# Patient Record
Sex: Male | Born: 1990 | Race: White | Hispanic: No | Marital: Single | Smoking: Light tobacco smoker
Health system: Southern US, Community
[De-identification: ages and names within clinical notes are randomized; demographics above are authoritative.]

## PROBLEM LIST (undated history)

## (undated) DIAGNOSIS — Z8349 Family history of other endocrine, nutritional and metabolic diseases: Secondary | ICD-10-CM

## (undated) DIAGNOSIS — M47812 Spondylosis without myelopathy or radiculopathy, cervical region: Secondary | ICD-10-CM

## (undated) DIAGNOSIS — G4733 Obstructive sleep apnea (adult) (pediatric): Secondary | ICD-10-CM

## (undated) DIAGNOSIS — F411 Generalized anxiety disorder: Secondary | ICD-10-CM

## (undated) DIAGNOSIS — E782 Mixed hyperlipidemia: Secondary | ICD-10-CM

## (undated) DIAGNOSIS — F419 Anxiety disorder, unspecified: Secondary | ICD-10-CM

## (undated) DIAGNOSIS — K589 Irritable bowel syndrome without diarrhea: Secondary | ICD-10-CM

## (undated) DIAGNOSIS — R350 Frequency of micturition: Secondary | ICD-10-CM

## (undated) DIAGNOSIS — F1010 Alcohol abuse, uncomplicated: Secondary | ICD-10-CM

## (undated) DIAGNOSIS — N529 Male erectile dysfunction, unspecified: Secondary | ICD-10-CM

## (undated) DIAGNOSIS — K2920 Alcoholic gastritis without bleeding: Secondary | ICD-10-CM

## (undated) DIAGNOSIS — E291 Testicular hypofunction: Secondary | ICD-10-CM

## (undated) DIAGNOSIS — K219 Gastro-esophageal reflux disease without esophagitis: Secondary | ICD-10-CM

## (undated) HISTORY — DX: Alcohol abuse, uncomplicated: F10.10

## (undated) HISTORY — DX: Male erectile dysfunction, unspecified: N52.9

## (undated) HISTORY — DX: Irritable bowel syndrome without diarrhea: K58.9

## (undated) HISTORY — DX: Generalized anxiety disorder: F41.1

## (undated) HISTORY — DX: Testicular hypofunction: E29.1

## (undated) HISTORY — DX: Obstructive sleep apnea (adult) (pediatric): G47.33

## (undated) HISTORY — DX: Mixed hyperlipidemia: E78.2

## (undated) HISTORY — DX: Spondylosis without myelopathy or radiculopathy, cervical region: M47.812

## (undated) HISTORY — DX: Alcoholic gastritis without bleeding: K29.20

## (undated) HISTORY — DX: Gastro-esophageal reflux disease without esophagitis: K21.9

## (undated) HISTORY — DX: Family history of other endocrine, nutritional and metabolic diseases: Z83.49

## (undated) HISTORY — DX: Anxiety disorder, unspecified: F41.9

## (undated) HISTORY — DX: Frequency of micturition: R35.0

## (undated) HISTORY — PX: ESOPHAGOGASTRODUODENOSCOPY: SHX1529

## (undated) HISTORY — PX: CYSTOSCOPY: SUR368

---
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
## 2011-06-15 ENCOUNTER — Ambulatory Visit: Payer: Self-pay | Admitting: Family Medicine

## 2011-06-16 ENCOUNTER — Ambulatory Visit (INDEPENDENT_AMBULATORY_CARE_PROVIDER_SITE_OTHER): Payer: BC Managed Care – PPO | Admitting: Family Medicine

## 2011-06-16 ENCOUNTER — Encounter: Payer: Self-pay | Admitting: Family Medicine

## 2011-06-16 VITALS — BP 117/68 | HR 48 | Temp 98.2°F | Wt 168.0 lb

## 2011-06-16 DIAGNOSIS — B9789 Other viral agents as the cause of diseases classified elsewhere: Secondary | ICD-10-CM

## 2011-06-16 DIAGNOSIS — B349 Viral infection, unspecified: Secondary | ICD-10-CM

## 2011-06-16 DIAGNOSIS — Z23 Encounter for immunization: Secondary | ICD-10-CM

## 2011-06-16 NOTE — Progress Notes (Signed)
Office Note 06/16/2011  CC:  Chief Complaint  Patient presents with  . Establish Care    sinus infection    HPI:  Andre Raymond is a 21 y.o. White male who is here to establish care and discuss recent resp illness. Patient's most recent primary MD: Dr. Vernon Prey at [facility] in [place], [place]. Old records were not reviewed prior to or during today's visit.  Pt presents complaining of respiratory symptoms for about 2 days.  Mostly nasal congestion/runny nose, and PND.  No significant cough except some in the morning.  Had n/v/d initially for 2-3 d but this has resolved.  Appetite still down and gets brief twinges of nausea (primarily in mornings) that are affecting his PO intake--has lost about 10 lb in 2 wks.  Worst symptoms seems to be the intermittent nausea.  Lately the symptoms seem to be improving.  No fevers, no HA, no rash, no wheezing or SOB.  No ST.  Symptoms made worse by morning.  Symptoms improved by mucinex for brief periods (mucous gets clearer instead of green). Smoker? no Recent sick contact? Yes (sib) Muscle or joint aches? no Flu shot this season at least 2 wks ago? no  ROS: no n/v/d or abdominal pain.  No rash.  No neck stiffness.   +Mild fatigue.  +Mild appetite loss.   History reviewed. No pertinent past medical history.  Past Surgical History  Procedure Date  . Foot surgery 2004-05    Right foot--Bone spurs [facility])    Family History  Problem Relation Age of Onset  . Hypertension Father   . Hyperlipidemia Father   . Heart disease Maternal Grandfather     History   Social History  . Marital Status: Single    Spouse Name: N/A    Number of Children: N/A  . Years of Education: N/A   Occupational History  . Not on file.   Social History Main Topics  . Smoking status: Never Smoker   . Smokeless tobacco: Never Used  . Alcohol Use: Yes  . Drug Use: No  . Sexually Active: Not on file   Other Topics Concern  . 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    Outpatient Encounter Prescriptions as of 06/16/2011  Medication Sig Dispense Refill  . pseudoephedrine-guaifenesin (MUCINEX D) 60-600 MG per tablet Take 1 tablet by mouth every 12 (twelve) hours.        No Known Allergies  ROS Review of Systems  Constitutional: Positive for appetite change. Negative for fever, chills and fatigue.  HENT: Positive for congestion and postnasal drip. Negative for ear pain, sore throat, neck stiffness, dental problem and sinus pressure.   Eyes: Negative for discharge, redness and visual disturbance.  Respiratory: Negative for cough, chest tightness, shortness of breath and wheezing.   Cardiovascular: Negative for chest pain, palpitations and leg swelling.  Gastrointestinal: Negative for nausea, vomiting, abdominal pain, diarrhea and blood in stool.       See HPI   Genitourinary: Negative for dysuria, urgency, frequency, hematuria, flank pain and difficulty urinating.  Musculoskeletal: Negative for myalgias, back pain, joint swelling and arthralgias.  Skin: Negative for pallor and rash.  Neurological: Negative for dizziness, speech difficulty, weakness and headaches.  Hematological: Negative for adenopathy. Does not bruise/bleed easily.  Psychiatric/Behavioral: Negative for confusion and sleep disturbance. The patient is not  nervous/anxious.     PE; Blood pressure 117/68, pulse 48, temperature 98.2 F (36.8 C), temperature source Temporal, weight 168 lb (76.204 kg), SpO2 99.00%. VS: noted--normal. Gen: alert, NAD, well APPEARING. HEENT: eyes without injection, drainage, or swelling.  Ears: EACs clear, TMs with normal light reflex and landmarks.  Nose: Clear rhinorrhea, with some dried, crusty exudate adherent to mildly injected mucosa.  No purulent d/c.  No paranasal sinus TTP.  No facial  swelling.  Throat and mouth without focal lesion.  No pharyngial swelling, erythema, or exudate.   Neck: supple, no LAD.   LUNGS: CTA bilat, nonlabored resps.   CV: RRR, no m/r/g. ABD: soft, NT, ND, BS normal.  No hepatospenomegaly or mass.  No bruits. EXT: no c/c/e SKIN: no rash  Pertinent labs:  none  ASSESSMENT AND PLAN:   Viral syndrome Self-limited nature of this illness was discussed, questions answered.  Discussed symptomatic care, rest, fluids.   Warning signs/symptoms of worsening illness were discussed.  Patient instructed to call or return if any of these occur. D/C mucinex D. Start allegra D 24h OTC qd and nasal saline spray 2-3 times per day. If PND and intermittent nausea are persisting in another week then I asked him to call me back or return. Flu vaccine given IM today.   Flu vaccine given IM today.  Return if symptoms worsen or fail to improve.

## 2011-06-16 NOTE — Patient Instructions (Signed)
Allegra D 24 hour (generic), 1 tab qd. Saline nasal spray (OTC): 1-2 sprays each nostril 2-3 times per day. Stop mucinex-D.

## 2011-06-16 NOTE — Assessment & Plan Note (Signed)
Self-limited nature of this illness was discussed, questions answered.  Discussed symptomatic care, rest, fluids.   Warning signs/symptoms of worsening illness were discussed.  Patient instructed to call or return if any of these occur. D/C mucinex D. Start allegra D 24h OTC qd and nasal saline spray 2-3 times per day. If PND and intermittent nausea are persisting in another week then I asked him to call me back or return. Flu vaccine given IM today.

## 2011-06-24 ENCOUNTER — Encounter: Payer: Self-pay | Admitting: Family Medicine

## 2011-06-24 ENCOUNTER — Ambulatory Visit (INDEPENDENT_AMBULATORY_CARE_PROVIDER_SITE_OTHER): Payer: BC Managed Care – PPO | Admitting: Family Medicine

## 2011-06-24 VITALS — BP 125/72 | HR 61 | Ht 72.0 in | Wt 167.0 lb

## 2011-06-24 DIAGNOSIS — F411 Generalized anxiety disorder: Secondary | ICD-10-CM

## 2011-06-24 MED ORDER — PAROXETINE HCL 20 MG PO TABS: 20.0000 mg | ORAL_TABLET | ORAL | Status: DC

## 2011-06-24 NOTE — Assessment & Plan Note (Signed)
With some panic and mild agoraphobia. Start paxil 20mg  qd.  Therapeutic expectations and side effect profile of medication discussed today.  Patient's questions answered. Recheck in office in 3-4 wks.

## 2011-06-24 NOTE — Progress Notes (Signed)
OFFICE VISIT  06/24/2011   CC:  Chief Complaint  Patient presents with  . Depression    discuss depression and anger issues     HPI:    Patient is a 21 y.o. Caucasian male who presents for anxiety. Describes always being nervous/anxious, worries about everything, easily frustrated/irritated, lets things eat at him, feels like he has had it all his life but with transition into adulthood and its increased responsibilities he is not coping with it as well.  Parents are noting he's getting worse and encouraged him to come in and get help.  Describes some periods in which his anxiety builds to a crescendo---panic sx's, palpitations/heart racing, lightheaded, knot in stomach.  More periods of decreased energy lately as well as more appetite loss.  Concentration is often poor lately, always feels keyed up.  Some mild sleep dysfunction but nothing consistent. These episodes always occur in the context of existing anxiety, never unprovoked or "out of the blue".  Describes mild agoraphobia as well.  Has never been on meds for anxiety, never been to a counselor.  Denies depressed/sad mood currently but admits to going through periods of being "down in the dumps" for a couple of weeks at a time and then he pulls out of it.  Never has been suicidal.  Never has abused alcohol or drugs. Mom and dad both struggle with anxiety.  Of note, he says he is comletely well regarding his recent viral syndrome that I saw him for about a week ago.  History reviewed. No pertinent past medical history. No hospitalizations, no significant illnesses.  Past Surgical History  Procedure Date  . Foot surgery 2004-05    Right foot--Bone spurs [facility])    MEDS: none  No Known Allergies  ROS No hearing or vision changes, no headaches, no focal weakness, no rash, no arthralgias, no n/v/d, no chest pain or SOB, no urinary complaints.   PE: Blood pressure 125/72, pulse 61, height 6' (1.829 m), weight 167 lb (75.751  kg). Wt Readings from Last 2 Encounters:  06/24/11 167 lb (75.751 kg)  06/16/11 168 lb (76.204 kg)    Gen: alert, oriented x 4, affect pleasant.  Lucid thinking and conversation noted. HEENT: PERRLA, EOMI.   Neck: no LAD, mass, or thyromegaly. CV: RRR, no m/r/g LUNGS: CTA bilat, nonlabored. NEURO: no tremor or tics noted on observation.  Coordination intact. CN 2-12 grossly intact bilaterally, strength 5/5 in all extremeties.  No ataxia.   LABS:  none  IMPRESSION AND PLAN:  Generalized anxiety disorder With some panic and mild agoraphobia. Start paxil 20mg  qd.  Therapeutic expectations and side effect profile of medication discussed today.  Patient's questions answered. Recheck in office in 3-4 wks.     FOLLOW UP: No Follow-up on file.

## 2011-08-19 ENCOUNTER — Ambulatory Visit (INDEPENDENT_AMBULATORY_CARE_PROVIDER_SITE_OTHER): Payer: BC Managed Care – PPO | Admitting: Family Medicine

## 2011-08-19 ENCOUNTER — Telehealth: Payer: Self-pay | Admitting: Family Medicine

## 2011-08-19 ENCOUNTER — Encounter: Payer: Self-pay | Admitting: Family Medicine

## 2011-08-19 VITALS — BP 129/69 | HR 59 | Temp 99.0°F | Ht 72.0 in | Wt 158.8 lb

## 2011-08-19 DIAGNOSIS — F411 Generalized anxiety disorder: Secondary | ICD-10-CM

## 2011-08-19 DIAGNOSIS — F32A Depression, unspecified: Secondary | ICD-10-CM

## 2011-08-19 DIAGNOSIS — R634 Abnormal weight loss: Secondary | ICD-10-CM

## 2011-08-19 DIAGNOSIS — R110 Nausea: Secondary | ICD-10-CM

## 2011-08-19 DIAGNOSIS — K589 Irritable bowel syndrome without diarrhea: Secondary | ICD-10-CM

## 2011-08-19 DIAGNOSIS — F341 Dysthymic disorder: Secondary | ICD-10-CM

## 2011-08-19 DIAGNOSIS — F329 Major depressive disorder, single episode, unspecified: Secondary | ICD-10-CM

## 2011-08-19 LAB — RENAL FUNCTION PANEL
Albumin: 4.6 g/dL (ref 3.5–5.2)
BUN: 12 mg/dL (ref 6–23)
CO2: 29 mEq/L (ref 19–32)
Calcium: 9.9 mg/dL (ref 8.4–10.5)
Chloride: 100 mEq/L (ref 96–112)
Creatinine, Ser: 0.9 mg/dL (ref 0.4–1.5)
GFR: 108.21 mL/min (ref >60.00)
Glucose, Bld: 85 mg/dL (ref 70–99)
Phosphorus: 2.9 mg/dL (ref 2.3–4.6)
Potassium: 4.1 mEq/L (ref 3.5–5.1)
Sodium: 139 mEq/L (ref 135–145)

## 2011-08-19 LAB — CBC
HCT: 44.3 % (ref 39.0–52.0)
Hemoglobin: 14.7 g/dL (ref 13.0–17.0)
MCHC: 33.3 g/dL (ref 30.0–36.0)
Platelets: 178 10*3/uL (ref 150.0–400.0)
RBC: 4.69 Mil/uL (ref 4.22–5.81)

## 2011-08-19 LAB — HEPATIC FUNCTION PANEL
Bilirubin, Direct: 0.2 mg/dL (ref 0.0–0.3)
Total Protein: 8 g/dL (ref 6.0–8.3)

## 2011-08-19 MED ORDER — SERTRALINE HCL 50 MG PO TABS: 50.0000 mg | ORAL_TABLET | Freq: Every day | ORAL | Status: DC

## 2011-08-19 MED ORDER — ONDANSETRON HCL 8 MG PO TABS: 8.0000 mg | ORAL_TABLET | Freq: Three times a day (TID) | ORAL | Status: AC | PRN

## 2011-08-19 NOTE — Patient Instructions (Signed)
Depression, Adolescent and Adult Depression is a true and treatable medical condition. In general there are two kinds of depression:  Depression we all experience in some form. For example depression from the death of a loved one, financial distress or natural disasters will trigger or increase depression.   Clinical depression, on the other hand, appears without an apparent cause or reason. This depression is a disease. Depression may be caused by chemical imbalance in the body and brain or may come as a response to a physical illness. Alcohol and other drugs can cause depression.  DIAGNOSIS  The diagnosis of depression is usually based upon symptoms and medical history. TREATMENT  Treatments for depression fall into three categories. These are:  Drug therapy. There are many medicines that treat depression. Responses may vary and sometimes trial and error is necessary to determine the best medicines and dosage for a particular patient.   Psychotherapy, also called talking treatments, helps people resolve their problems by looking at them from a different point of view and by giving people insight into their own personal makeup. Traditional psychotherapy looks at a childhood source of a problem. Other psychotherapy will look at current conflicts and move toward solving those. If the cause of depression is drug use, counseling is available to help abstain. In time the depression will usually improve. If there were underlying causes for the chemical use, they can be addressed.   ECT (electroconvulsive therapy) or shock treatment is not as commonly used today. It is a very effective treatment for severe suicidal depression. During ECT electrical impulses are applied to the head. These impulses cause a generalized seizure. It can be effective but causes a loss of memory for recent events. Sometimes this loss of memory may include the last several months.  Treat all depression or suicide threats as  serious. Obtain professional help. Do not wait to see if serious depression will get better over time without help. Seek help for yourself or those around you. In the [place] the number to the National Suicide Help Lines With 24 Hour Help Are: 1-800-SUICIDE 318-343-4627 Document Released: 04/09/2000 Document Revised: 04/01/2011 Document Reviewed: 11/29/2007 [facility] Patient Information 2012 [place], [place].  GINGER

## 2011-08-20 MED ORDER — RANITIDINE HCL 300 MG PO TABS: 300.0000 mg | ORAL_TABLET | Freq: Every day | ORAL | Status: DC

## 2011-08-20 NOTE — Telephone Encounter (Signed)
I spoke with patient's mom and she is going to call and speak with Abhi to see if she could get him to come in.

## 2011-08-20 NOTE — Telephone Encounter (Signed)
Patient's mother is requesting a CB, she states she is "having major issues with [name]"

## 2011-08-26 ENCOUNTER — Encounter: Payer: Self-pay | Admitting: Family Medicine

## 2011-08-26 DIAGNOSIS — K589 Irritable bowel syndrome without diarrhea: Secondary | ICD-10-CM

## 2011-08-26 HISTORY — DX: Irritable bowel syndrome, unspecified: K58.9
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
## 2011-08-26 NOTE — Assessment & Plan Note (Signed)
Struggling with dyspepsia, abdominal pain, nausea and vomiting. Is given ondansetron for nausea and Zantac for the dyspepsia. Avoid spicy and fatty foods.
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
## 2011-09-17 ENCOUNTER — Ambulatory Visit: Payer: BC Managed Care – PPO | Admitting: Family Medicine

## 2011-09-17 DIAGNOSIS — Z0289 Encounter for other administrative examinations: Secondary | ICD-10-CM

## 2011-11-02 ENCOUNTER — Other Ambulatory Visit: Payer: Self-pay | Admitting: Family Medicine

## 2012-01-03 ENCOUNTER — Other Ambulatory Visit: Payer: Self-pay | Admitting: Family Medicine

## 2012-01-03 NOTE — Telephone Encounter (Signed)
PC to pt number [phone] and was told I had the wrong number.  PC to pt mother per DPR and was given correct number.  Mother states it may be hard to get in touch with him.  Mother wanted to know if [facility] due for appt.  Advised yes.  appt scheduled for 01/26/12 @ 330.  30 day supply sent to pharmacy.

## 2012-01-26 ENCOUNTER — Ambulatory Visit: Payer: BC Managed Care – PPO | Admitting: Family Medicine

## 2012-10-31 ENCOUNTER — Ambulatory Visit (INDEPENDENT_AMBULATORY_CARE_PROVIDER_SITE_OTHER): Payer: BC Managed Care – PPO | Admitting: Nurse Practitioner

## 2012-10-31 ENCOUNTER — Encounter: Payer: Self-pay | Admitting: Nurse Practitioner

## 2012-10-31 VITALS — BP 110/80 | HR 110 | Temp 97.9°F | Ht 72.0 in | Wt 181.2 lb

## 2012-10-31 DIAGNOSIS — R1031 Right lower quadrant pain: Secondary | ICD-10-CM

## 2012-10-31 DIAGNOSIS — R300 Dysuria: Secondary | ICD-10-CM

## 2012-10-31 LAB — POCT URINALYSIS DIPSTICK
Protein, UA: NEGATIVE
Spec Grav, UA: 1.01
Urobilinogen, UA: 0.2

## 2012-10-31 MED ORDER — CIPROFLOXACIN HCL 250 MG PO TABS: 250.0000 mg | ORAL_TABLET | Freq: Two times a day (BID) | ORAL | Status: DC

## 2012-10-31 NOTE — Patient Instructions (Signed)
Increase fluids. Call if symptoms not improving.  Urethritis, Adult Urethritis is an inflammation (soreness) of the urethra (the tube exiting from the bladder). It is often caused by germs that may be spread through sexual contact. TREATMENT  Urethritis will usually respond to antibiotics. These are medications that kill germs. Take all the medicine given to you. You may feel better in a couple days, but TAKE ALL MEDICINE or the infection may not be completely cured and may become more difficult to treat. Response can generally be expected in 7 to 10 days. You may require additional treatment after more testing. HOME CARE INSTRUCTIONS  Not have sex until the test results are known and treatment is completed.  Know that you may be asked to notify your sex partner when your final test results are back.  Finish all medications as prescribed.  Prevent sexually transmitted infections including AIDS. Practice safe sex. Use condoms. SEEK MEDICAL CARE IF:   Your symptoms are not improved in 2 to 3 days.  Your symptoms are getting worse.  Your develop abdominal pain.  You develop joint pain. SEEK IMMEDIATE MEDICAL CARE IF:   You have a fever.  You develop severe pain in the belly, back or side.  You develop repeated vomiting. TEST RESULTS Not all test results are available during your visit. If your test results are not back during the visit, make an appointment with your caregiver to find out the results. Do not assume everything is normal if you have not heard from your caregiver or the medical facility. It is important for you to follow-up on all of your test results. Document Released: 10/06/2000 Document Revised: 07/05/2011 Document Reviewed: 04/28/2009 [facility] Patient Information 2014 [place], [place].

## 2012-10-31 NOTE — Progress Notes (Signed)
  Subjective:    Patient ID: Andre Raymond, male    DOB: 02/19/1991, 22 y.o.   MRN: [id number]  Dysuria  This is a new problem. The current episode started in the past 7 days (3days ago after eating sushi that caused diarrhea & vomitimg). The problem occurs every urination. The problem has been gradually improving (still loose stools, but getting better, n&v resolved, continues to have dysuria & low pelvic pain). The quality of the pain is described as stabbing. The pain is moderate. Maximum temperature: reports chills & sweating 2 days ago. He is not sexually active. There is no history of pyelonephritis. Associated symptoms include chills, nausea, sweats and vomiting (resolved). Pertinent negatives include no discharge, flank pain, frequency, hematuria or urgency. There is no history of kidney stones or recurrent UTIs.      Review of Systems  Constitutional: Positive for chills.  Respiratory: Negative for cough and shortness of breath.   Cardiovascular: Negative for chest pain.  Gastrointestinal: Positive for nausea, vomiting (resolved), abdominal pain (RLQ, sharp, shooting, intermittent) and diarrhea (now loose, rather than diarrhea).  Genitourinary: Positive for dysuria and penile pain (urethral pain). Negative for urgency, frequency, hematuria, flank pain, discharge, penile swelling, genital sores and testicular pain.  Musculoskeletal: Negative for arthralgias.  Neurological: Negative for headaches.  Hematological: Negative for adenopathy.       Objective:   Physical Exam  Vitals reviewed. Constitutional: He is oriented to person, place, and time. He appears well-developed and well-nourished. No distress.  HENT:  Head: Normocephalic and atraumatic.  Eyes: Conjunctivae are normal. Right eye exhibits no discharge. Left eye exhibits no discharge.  Cardiovascular: Normal rate.   Pulmonary/Chest: Effort normal.  Abdominal: Soft. Bowel sounds are normal. He exhibits no distension and no  mass. There is no tenderness. There is no rebound and no guarding.  Musculoskeletal:  Normal gait, no CVA tenderness  Neurological: He is alert and oriented to person, place, and time.  Skin: Skin is warm and dry.  Psychiatric: He has a normal mood and affect. His behavior is normal. Thought content normal.          Assessment & Plan:  1. Dysuria WU:[id number], cystitis - Urine culture - POCT urinalysis dipstick - GC probe amplification, urine - ciprofloxacin (CIPRO) 250 MG tablet; Take 1 tablet (250 mg total) by mouth 2 (two) times daily.  Dispense: 6 tablet; Refill: 0  2. Abdominal pain, RLQ (right lower quadrant) Possibly r/t food poisoning, symptoms resolving, keeping food & fluids down yesterday & today

## 2012-11-01 LAB — NEISSERIA GONORRHOEAE, PROBE AMP: GC Probe RNA: NEGATIVE

## 2012-11-03 LAB — URINE CULTURE: Colony Count: >=100000

## 2013-01-24 ENCOUNTER — Ambulatory Visit (INDEPENDENT_AMBULATORY_CARE_PROVIDER_SITE_OTHER): Payer: BC Managed Care – PPO | Admitting: Family Medicine

## 2013-01-24 ENCOUNTER — Encounter: Payer: Self-pay | Admitting: Family Medicine

## 2013-01-24 VITALS — BP 121/72 | HR 56 | Temp 98.9°F | Resp 16 | Ht 72.0 in | Wt 175.0 lb

## 2013-01-24 DIAGNOSIS — M2420 Disorder of ligament, unspecified site: Secondary | ICD-10-CM

## 2013-01-24 NOTE — Progress Notes (Signed)
OFFICE NOTE  01/24/2013  CC:  Chief Complaint  Patient presents with  . poping sound    in neck      HPI: Patient is a 22 y.o. Caucasian male who is here for a popping sound coming from the back of his head/neck. No pain.  No hx of injury to neck or skull.  He hears the sound exclusively when he runs, [place] and heard it.  It goes away when he is still.  It does not occur every time he runs.  Denies ear pain, popping, fullness, or decreased hearing.   Pertinent PMH:  Past Medical History  Diagnosis Date  . IBS (irritable bowel syndrome) 08/26/2011    MEDS:  NONE  PE: Blood pressure 121/72, pulse 56, temperature 98.9 F (37.2 C), temperature source Temporal, resp. rate 16, height 6' (1.829 m), weight 175 lb (79.379 kg), SpO2 100.00%. Gen: Alert, well appearing.  Patient is oriented to person, place, time, and situation. ENT: Ears: EACs clear, normal epithelium.  TMs with good light reflex and landmarks bilaterally.  Eyes: no injection, icteris, swelling, or exudate.  EOMI, PERRLA. Nose: no drainage or turbinate edema/swelling.  No injection or focal lesion.  Mouth: lips without lesion/swelling.  Oral mucosa pink and moist.  Dentition intact and without obvious caries or gingival swelling.  Oropharynx without erythema, exudate, or swelling.  Occiput and cervical spine entirely nontender.  No deformity or palpable abnormality. Neck ROM fully intact without pain, stiffness, or popping. UE strength 5/5 prox dist bilat.  IMPRESSION AND PLAN:  Likely posterior cervical spine tendon or ligament sliding over a bony prominence of his occiput. I detect no sign of abnormality today. Reassured pt. Signs/symptoms to call or return for were reviewed and pt expressed understanding.   FOLLOW UP: prn

## 2013-11-05 ENCOUNTER — Encounter: Payer: Self-pay | Admitting: Family Medicine

## 2013-11-05 ENCOUNTER — Ambulatory Visit (INDEPENDENT_AMBULATORY_CARE_PROVIDER_SITE_OTHER): Payer: BC Managed Care – PPO | Admitting: Family Medicine

## 2013-11-05 VITALS — BP 177/99 | HR 77 | Temp 98.7°F | Ht 72.0 in | Wt 192.0 lb

## 2013-11-05 DIAGNOSIS — R030 Elevated blood-pressure reading, without diagnosis of hypertension: Secondary | ICD-10-CM

## 2013-11-05 DIAGNOSIS — I10 Essential (primary) hypertension: Secondary | ICD-10-CM

## 2013-11-05 DIAGNOSIS — L270 Generalized skin eruption due to drugs and medicaments taken internally: Secondary | ICD-10-CM

## 2013-11-05 NOTE — Progress Notes (Signed)
Pre visit review using our clinic review tool, if applicable. No additional management support is needed unless otherwise documented below in the visit note. 

## 2013-11-05 NOTE — Patient Instructions (Signed)
Check bp at home 1-2 times a day for the next 1 wk. Normal bp is <140 on top and <90 on bottom. Call or return if consistently >180 on top or >110 on bottom. Call if not back to normal bp in 1 wk.  Take benadryl 25mg  every 6 hours as normal.

## 2013-11-05 NOTE — Progress Notes (Signed)
OFFICE NOTE  11/05/2013  CC: Itching  HPI: Patient is a 23 y.o. Caucasian male who is here for itching. He got some kind of street testosterone injection x 4 over the last 2 wks (most recent injection was 4 d/a).  He broke out in hive-like rash 3 d/a and it has come and gone since then.  Benadryl PO seems to help.  Noted the rash on low back, neck, underside of his arms, groin/inner thighs.    ROS: no HAs, no joint aches, no n/v/d, no fevers, no dizziness, no vision complaints.  Pertinent PMH:  Past medical, surgical, social, and family history reviewed and no changes are noted since last office visit.  MEDS:  NONE except as noted in HPI  PE: Blood pressure 177/99, pulse 77, temperature 98.7 F (37.1 C), temperature source Temporal, height 6' (1.829 m), weight 192 lb (87.091 kg), SpO2 100.00%. Gen: Alert, well appearing.  Patient is oriented to person, place, time, and situation. FAO:ZHYQENT:Eyes: no injection, icteris, swelling, or exudate.  EOMI, PERRLA. Mouth: lips without lesion/swelling.  Oral mucosa pink and moist. Oropharynx without erythema, exudate, or swelling.  CV: RRR, no m/r/g.   LUNGS: CTA bilat, nonlabored resps, good aeration in all lung fields. EXT: no clubbing, cyanosis, or edema.  Skin - no sores or suspicious lesions or rashes or color changes   IMPRESSION AND PLAN:  Allergic rash, elevated bp: secondary to recent injection of nonprescription/"street" testosterone. Continue benadryl 25mg  q6h prn--expect rash and bp to calm down over the next week or so. Check bp at home 1-2 times a day for the next 1 wk. Normal bp is <140 on top and <90 on bottom. Call or return if consistently >180 on top or >110 on bottom. Call if not back to normal bp in 1 wk.  An After Visit Summary was printed and given to the patient.  FOLLOW UP: prn

## 2014-06-06 ENCOUNTER — Encounter: Payer: Self-pay | Admitting: Nurse Practitioner

## 2014-06-06 ENCOUNTER — Ambulatory Visit (INDEPENDENT_AMBULATORY_CARE_PROVIDER_SITE_OTHER): Payer: Self-pay | Admitting: Nurse Practitioner

## 2014-06-06 VITALS — BP 102/60 | HR 55 | Temp 97.7°F | Ht 72.0 in | Wt 186.0 lb

## 2014-06-06 DIAGNOSIS — S161XXA Strain of muscle, fascia and tendon at neck level, initial encounter: Secondary | ICD-10-CM

## 2014-06-06 HISTORY — DX: Strain of muscle, fascia and tendon at neck level, initial encounter: S16.1XXA

## 2014-06-06 NOTE — Patient Instructions (Addendum)
I think you have pulled a muscle or have mild nerve inflammation in neck.  To decrease inflammation, take 400 mg ibuprophen twice daily with food for 3 days. Use moist heat 3 times daily-(sock with dry rice).  No upper body work out for 5 to 7 days.  Return in 2 weeks or sooner if you develop new symptoms or pain is not decreasing.

## 2014-06-06 NOTE — Progress Notes (Signed)
Pre visit review using our clinic review tool, if applicable. No additional management support is needed unless otherwise documented below in the visit note. 

## 2014-06-06 NOTE — Progress Notes (Signed)
Subjective:     Andre Raymond is a 24 y.o. male who presents for evaluation of neck pain. Event that precipitated these symptoms: pain began while lifting weights with arms. Onset of symptoms was 2 days ago, and have been unchanged since that time. Current symptoms are sharp shooting pain R occipital head & posterior R neck  . Pain is not constant-occurs only when working out with weights with upper body. Patient denies numbness in neck & R arm, paresthesias in R arm, shoulder, face and weakness in R arm, vision changes, nausea, HA. Patient has had no prior neck problems. Previous treatments: none and ibuprophen & ice.  The following portions of the patient's history were reviewed and updated as appropriate: allergies, current medications, past medical history, past social history, past surgical history and problem list.  Review of Systems Pertinent items are noted in HPI.    Objective:    BP 102/60 mmHg  Pulse 55  Temp(Src) 97.7 F (36.5 C) (Oral)  Ht 6' (1.829 m)  Wt 186 lb (84.369 kg)  BMI 25.22 kg/m2  SpO2 97% General:   alert, cooperative, appears stated age and no distress  External Deformity:  absent  ROM Cervical Spine:  normal range of motion  Midline Tenderness:  absent   Paraspinous tenderness:  absent bilateral neck, & traps Neg spurling's bilat  UE Neurologic Exam: Head:  normal strength No occipital or temporal tenderness      Assessment:Plan  1. Neck muscle strain, initial encounter Heat three times daily ibuporphen 400 mg bid with food for 3 days No upper body work out for 5-7 days Ret in 2 weeks

## 2015-05-27 ENCOUNTER — Encounter: Payer: Self-pay | Admitting: Family Medicine

## 2015-05-27 ENCOUNTER — Ambulatory Visit (INDEPENDENT_AMBULATORY_CARE_PROVIDER_SITE_OTHER): Payer: BLUE CROSS/BLUE SHIELD | Admitting: Family Medicine

## 2015-05-27 VITALS — BP 123/75 | HR 67 | Temp 98.4°F | Resp 16 | Ht 72.0 in | Wt 191.8 lb

## 2015-05-27 DIAGNOSIS — F528 Other sexual dysfunction not due to a substance or known physiological condition: Secondary | ICD-10-CM | POA: Diagnosis not present

## 2015-05-27 DIAGNOSIS — F5221 Male erectile disorder: Secondary | ICD-10-CM

## 2015-05-27 NOTE — Progress Notes (Signed)
Pre visit review using our clinic review tool, if applicable. No additional management support is needed unless otherwise documented below in the visit note. 

## 2015-05-27 NOTE — Progress Notes (Signed)
OFFICE NOTE  05/27/2015  CC:  Chief Complaint  Patient presents with  . Personal Problem   HPI: Patient is a 25 y.o. Caucasian male who is here for recently experiencing loss of erection during intercourse.  It has happened a few times, but also has still had times recently that he was able to keep erection to climax w/out problem.  Sexual desire/libido is intact. No penis/erectile pain, no hx of penile trauma.  He denies depression. He is very anxious about the problem, wonders if he has blood flow or testosterone problems. He does take wt lifting supplements of some kind that likely affects his testosterone level. No dysuria, no hematuria, no urinary urgency or frequency, no penile d/c. No anal/prostate region pain.  Pertinent PMH:  Past medical, surgical, social, and family history reviewed and no changes are noted since last office visit.  MEDS:  NONE  PE: Blood pressure 123/75, pulse 67, temperature 98.4 F (36.9 C), temperature source Oral, resp. rate 16, height 6' (1.829 m), weight 191 lb 12 oz (86.977 kg), SpO2 98 %. Gen: Alert, well appearing.  Patient is oriented to person, place, time, and situation. No further exam today.  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 Reassured pt. Recommended he d/c the supplement he takes when he finishes the current course he is on--just to take this out of the equation b/c I doubt it is the cause of his difficulty. I discouraged use of his friend's ED med. We discussed general measures he and his girlfriend can take to minimize the performance anxiety he has---graded exposure, etc. Signs/symptoms to call or return for were reviewed and pt expressed understanding.  An After Visit Summary was printed and given to the patient.  FOLLOW UP: prn

## 2015-08-26 ENCOUNTER — Encounter: Payer: BLUE CROSS/BLUE SHIELD | Admitting: Family Medicine

## 2015-09-03 ENCOUNTER — Ambulatory Visit (INDEPENDENT_AMBULATORY_CARE_PROVIDER_SITE_OTHER): Payer: BLUE CROSS/BLUE SHIELD | Admitting: Family Medicine

## 2015-09-03 ENCOUNTER — Encounter: Payer: Self-pay | Admitting: Family Medicine

## 2015-09-03 ENCOUNTER — Other Ambulatory Visit: Payer: Self-pay | Admitting: Family Medicine

## 2015-09-03 VITALS — BP 119/73 | HR 67 | Temp 97.8°F | Ht 72.0 in | Wt 188.4 lb

## 2015-09-03 DIAGNOSIS — R6882 Decreased libido: Secondary | ICD-10-CM

## 2015-09-03 DIAGNOSIS — Z0000 Encounter for general adult medical examination without abnormal findings: Secondary | ICD-10-CM | POA: Diagnosis not present

## 2015-09-03 LAB — COMPREHENSIVE METABOLIC PANEL
ALBUMIN: 4.7 g/dL (ref 3.5–5.2)
ALT: 125 U/L — ABNORMAL HIGH (ref 0–53)
AST: 185 U/L — ABNORMAL HIGH (ref 0–37)
Alkaline Phosphatase: 51 U/L (ref 39–117)
BUN: 15 mg/dL (ref 6–23)
CALCIUM: 10.1 mg/dL (ref 8.4–10.5)
CHLORIDE: 99 meq/L (ref 96–112)
CO2: 29 mEq/L (ref 19–32)
Creatinine, Ser: 1.25 mg/dL (ref 0.40–1.50)
GFR: 74.31 mL/min (ref >60.00)
Glucose, Bld: 82 mg/dL (ref 70–99)
POTASSIUM: 3.8 meq/L (ref 3.5–5.1)
Sodium: 139 mEq/L (ref 135–145)
Total Bilirubin: 1 mg/dL (ref 0.2–1.2)
Total Protein: 7.4 g/dL (ref 6.0–8.3)

## 2015-09-03 LAB — LIPID PANEL
CHOLESTEROL: 240 mg/dL — AB (ref 0–200)
HDL: 50.8 mg/dL (ref >39.00)
LDL CALC: 156 mg/dL — AB (ref 0–99)
NonHDL: 189.17
TRIGLYCERIDES: 168 mg/dL — AB (ref 0.0–149.0)
Total CHOL/HDL Ratio: 5
VLDL: 33.6 mg/dL (ref 0.0–40.0)

## 2015-09-03 LAB — CBC WITH DIFFERENTIAL/PLATELET
Basophils Absolute: 0 10*3/uL (ref 0.0–0.1)
Basophils Relative: 0.3 % (ref 0.0–3.0)
EOS PCT: 1.9 % (ref 0.0–5.0)
Eosinophils Absolute: 0.3 10*3/uL (ref 0.0–0.7)
HCT: 43 % (ref 39.0–52.0)
Hemoglobin: 14.8 g/dL (ref 13.0–17.0)
LYMPHS ABS: 2.7 10*3/uL (ref 0.7–4.0)
Lymphocytes Relative: 16.9 % (ref 12.0–46.0)
MCHC: 34.5 g/dL (ref 30.0–36.0)
MCV: 91.2 fl (ref 78.0–100.0)
MONO ABS: 0.9 10*3/uL (ref 0.1–1.0)
Monocytes Relative: 5.4 % (ref 3.0–12.0)
NEUTROS ABS: 12 10*3/uL — AB (ref 1.4–7.7)
Neutrophils Relative %: 75.5 % (ref 43.0–77.0)
PLATELETS: 219 10*3/uL (ref 150.0–400.0)
RBC: 4.72 Mil/uL (ref 4.22–5.81)
RDW: 14.8 % (ref 11.5–15.5)
WBC: 15.9 10*3/uL — ABNORMAL HIGH (ref 4.0–10.5)

## 2015-09-03 LAB — TSH: TSH: 0.8 u[IU]/mL (ref 0.35–4.50)

## 2015-09-03 NOTE — Progress Notes (Signed)
Office Note 09/03/2015  CC:  Chief Complaint  Patient presents with  . Annual Exam   HPI:  Andre Raymond is a 25 y.o. White male who is here for annual health maintenance exam. Got back on a health kick again recenlty, eating very well and working out. C/o some decreased libido.  Also has hx of taking testosterone supplement in the past---nothing in at least a week (the "post" part of a regimen).  Notes easy fatigue with his work outs of late.   Past Medical History  Diagnosis Date  . IBS (irritable bowel syndrome) 08/26/2011  . GAD (generalized anxiety disorder)     Past Surgical History  Procedure Laterality Date  . Foot surgery  2004-05    Right foot--Bone spurs [facility])    Family History  Problem Relation Age of Onset  . Hypertension Father   . Hyperlipidemia Father   . Heart disease Maternal Grandfather     Social History   Social History  . Marital Status: Single    Spouse Name: N/A  . Number of Children: N/A  . Years of Education: N/A   Occupational History  . Not on file.   Social History Main Topics  . Smoking status: Never Smoker   . Smokeless tobacco: Never Used  . Alcohol Use: Yes  . Drug Use: No  . Sexual Activity: Not on file   Other Topics Concern  . 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   Possibly going to [employer] or [employer] to finish business degree.   No T/A/Ds.   Has one brother.   He exercises at least 3 times per week.    No outpatient prescriptions prior to visit.   No facility-administered medications prior to visit.    No Known Allergies  ROS Review of Systems  Constitutional: Negative for fever, chills, appetite change and fatigue.  HENT: Negative for congestion, dental problem, ear pain and sore throat.   Eyes: Negative for discharge, redness and visual disturbance.  Respiratory: Negative for cough, chest tightness,  shortness of breath and wheezing.   Cardiovascular: Negative for chest pain, palpitations and leg swelling.  Gastrointestinal: Negative for nausea, vomiting, abdominal pain, diarrhea and blood in stool.  Genitourinary: Negative for dysuria, urgency, frequency, hematuria, flank pain and difficulty urinating.  Musculoskeletal: Negative for myalgias, back pain, joint swelling, arthralgias and neck stiffness.  Skin: Negative for pallor and rash.  Neurological: Negative for dizziness, speech difficulty, weakness and headaches.  Hematological: Negative for adenopathy. Does not bruise/bleed easily.  Psychiatric/Behavioral: Negative for confusion and sleep disturbance.    PE; Blood pressure 119/73, pulse 67, temperature 97.8 F (36.6 C), temperature source Oral, height 6' (1.829 m), weight 188 lb 6.4 oz (85.458 kg), SpO2 96 %. Gen: Alert, well appearing.  Patient is oriented to person, place, time, and situation. AFFECT: pleasant, lucid thought and speech. ENT: Ears: EACs clear, normal epithelium.  TMs with good light reflex and landmarks bilaterally.  Eyes: no injection, icteris, swelling, or exudate.  EOMI, PERRLA. Nose: no drainage or turbinate edema/swelling.  No injection or focal lesion.  Mouth: lips without lesion/swelling.  Oral mucosa pink and moist.  Dentition intact and without obvious caries or gingival swelling.  Oropharynx without erythema, exudate, or swelling.  Neck: supple/nontender.  No LAD, mass, or TM.  Carotid pulses 2+ bilaterally, without bruits. CV: RRR, no m/r/g.   LUNGS: CTA bilat,  nonlabored resps, good aeration in all lung fields. ABD: soft, NT, ND, BS normal.  No hepatospenomegaly or mass.  No bruits. EXT: no clubbing, cyanosis, or edema.  Musculoskeletal: no joint swelling, erythema, warmth, or tenderness.  ROM of all joints intact. Skin - no sores or suspicious lesions or rashes or color changes  Pertinent labs:  Lab Results  Component Value Date   TSH 1.21  08/19/2011   Lab Results  Component Value Date   WBC 8.3 08/19/2011   HGB 14.7 08/19/2011   HCT 44.3 08/19/2011   MCV 94.3 08/19/2011   PLT 178.0 08/19/2011   Lab Results  Component Value Date   CREATININE 0.9 08/19/2011   BUN 12 08/19/2011   NA 139 08/19/2011   K 4.1 08/19/2011   CL 100 08/19/2011   CO2 29 08/19/2011   Lab Results  Component Value Date   ALT 17 08/19/2011   AST 28 08/19/2011   ALKPHOS 74 08/19/2011   BILITOT 0.9 08/19/2011   ASSESSMENT AND PLAN:   Health maintenance exam: Reviewed age and gender appropriate health maintenance issues (prudent diet, regular exercise, health risks of tobacco and excessive alcohol, use of seatbelts, fire alarms in home, use of sunscreen).  Also reviewed age and gender appropriate health screening as well as vaccine recommendations. Tdap today. HP labs (fasting) today. Will also check total and free testosterone levels due to his history of recent impaired libido.  An After Visit Summary was printed and given to the patient.  FOLLOW UP:  Return in about 1 year (around 09/02/2016) for annual CPE (fasting).  Signed:  Santiago BumpersPhil McGowen, MD           09/03/2015

## 2015-09-04 LAB — TESTOSTERONE TOTAL,FREE,BIO, MALES
Albumin: 4.5 g/dL (ref 3.6–5.1)
SEX HORMONE BINDING: 15 nmol/L (ref 10–50)
Testosterone, Bioavailable: 195.5 ng/dL (ref 130.5–681.7)
Testosterone, Free: 95.1 pg/mL (ref 47.0–244.0)
Testosterone: 410 ng/dL (ref 250–827)

## 2015-09-05 ENCOUNTER — Other Ambulatory Visit: Payer: Self-pay | Admitting: Family Medicine

## 2015-09-05 DIAGNOSIS — D72829 Elevated white blood cell count, unspecified: Secondary | ICD-10-CM

## 2015-09-05 DIAGNOSIS — R740 Nonspecific elevation of levels of transaminase and lactic acid dehydrogenase [LDH]: Secondary | ICD-10-CM

## 2015-09-05 DIAGNOSIS — R7401 Elevation of levels of liver transaminase levels: Secondary | ICD-10-CM

## 2015-09-05 LAB — HEPATITIS B SURFACE ANTIBODY, QUANTITATIVE: HEPATITIS B-POST: 411 m[IU]/mL

## 2015-09-05 LAB — HEPATITIS C ANTIBODY: HCV AB: NEGATIVE

## 2015-10-06 ENCOUNTER — Other Ambulatory Visit: Payer: BLUE CROSS/BLUE SHIELD

## 2015-11-27 ENCOUNTER — Encounter: Payer: Self-pay | Admitting: Family Medicine

## 2015-11-27 ENCOUNTER — Other Ambulatory Visit: Payer: Self-pay | Admitting: Family Medicine

## 2015-11-27 ENCOUNTER — Ambulatory Visit (INDEPENDENT_AMBULATORY_CARE_PROVIDER_SITE_OTHER): Payer: BLUE CROSS/BLUE SHIELD | Admitting: Family Medicine

## 2015-11-27 VITALS — BP 119/74 | HR 72 | Temp 98.3°F | Resp 16 | Ht 72.0 in | Wt 190.5 lb

## 2015-11-27 DIAGNOSIS — R740 Nonspecific elevation of levels of transaminase and lactic acid dehydrogenase [LDH]: Secondary | ICD-10-CM | POA: Diagnosis not present

## 2015-11-27 DIAGNOSIS — R21 Rash and other nonspecific skin eruption: Secondary | ICD-10-CM | POA: Diagnosis not present

## 2015-11-27 DIAGNOSIS — D72829 Elevated white blood cell count, unspecified: Secondary | ICD-10-CM | POA: Diagnosis not present

## 2015-11-27 DIAGNOSIS — R7401 Elevation of levels of liver transaminase levels: Secondary | ICD-10-CM

## 2015-11-27 LAB — CBC WITH DIFFERENTIAL/PLATELET
BASOS ABS: 79 {cells}/uL (ref 0–200)
Basophils Relative: 1 %
EOS ABS: 632 {cells}/uL — AB (ref 15–500)
EOS PCT: 8 %
HEMATOCRIT: 46.7 % (ref 38.5–50.0)
HEMOGLOBIN: 15.4 g/dL (ref 13.2–17.1)
LYMPHS ABS: 2133 {cells}/uL (ref 850–3900)
Lymphocytes Relative: 27 %
MCH: 32.1 pg (ref 27.0–33.0)
MCHC: 33 g/dL (ref 32.0–36.0)
MCV: 97.3 fL (ref 80.0–100.0)
MONO ABS: 632 {cells}/uL (ref 200–950)
MPV: 10.6 fL (ref 7.5–12.5)
Monocytes Relative: 8 %
NEUTROS ABS: 4424 {cells}/uL (ref 1500–7800)
Neutrophils Relative %: 56 %
Platelets: 182 10*3/uL (ref 140–400)
RBC: 4.8 MIL/uL (ref 4.20–5.80)
RDW: 13.2 % (ref 11.0–15.0)
WBC: 7.9 10*3/uL (ref 3.8–10.8)

## 2015-11-27 NOTE — Progress Notes (Signed)
Pre visit review using our clinic review tool, if applicable. No additional management support is needed unless otherwise documented below in the visit note. 

## 2015-11-27 NOTE — Addendum Note (Signed)
Addended by: Smitty Knudsen on: 11/27/2015 03:59 PM   Modules accepted: Orders

## 2015-11-27 NOTE — Progress Notes (Signed)
OFFICE VISIT  11/27/2015   CC:  Chief Complaint  Patient presents with  . Rash  . Other    wants labs done     HPI:    Patient is a 25 y.o. Caucasian male who presents for rash. Had been taking IM supplement through a friend called tribulone acetate, to try to build muscle mass.  Two times a week for a month and stopped it last week.  He has felt some mood fluctuations/irritability plus some possible gynecomastia.    Also, he needs repeat of CBC and hepatic panel since last time we did these he has mild LFT elevations and WBC elevation.  We'll also do a peripheral blood smear today. He currently drinks 1-2 nights per week avg, 10-12 beers at a time.   He states he was not on any supplements when he got his last labs that were abnormal.  About a week ago he noted very small pink bumps on legs, then trunk---itchy.  Took benadryl.  This has essentially resolved now.  He read about his recent supplement and it describes this rash as a possible side effect. No fevers.  No swelling of lips, tongue, eyes, or throat.  No SOB or wheezing.  Past Medical History:  Diagnosis Date  . GAD (generalized anxiety disorder)   . IBS (irritable bowel syndrome) 08/26/2011    Past Surgical History:  Procedure Laterality Date  . FOOT SURGERY  2004-05   Right foot--Bone spurs [facility])    MEDS: none, except as stated in HPI (supplement)  No Known Allergies  ROS As per HPI  PE: Blood pressure 119/74, pulse 72, temperature 98.3 F (36.8 C), temperature source Oral, resp. rate 16, height 6' (1.829 m), weight 190 lb 8 oz (86.4 kg), SpO2 95 %. Gen: Alert, well appearing.  Patient is oriented to person, place, time, and situation. Chest wall: no gynecomastia is palpable. Skin: no rash except a few very small, pink "pinpoint" papules on legs.  LABS:  Lab Results  Component Value Date   TSH 0.80 09/03/2015   Lab Results  Component Value Date   WBC 15.9 (H) 09/03/2015   HGB 14.8 09/03/2015   HCT  43.0 09/03/2015   MCV 91.2 09/03/2015   PLT 219.0 09/03/2015   Lab Results  Component Value Date   CREATININE 1.25 09/03/2015   BUN 15 09/03/2015   NA 139 09/03/2015   K 3.8 09/03/2015   CL 99 09/03/2015   CO2 29 09/03/2015   Lab Results  Component Value Date   ALT 125 (H) 09/03/2015   AST 185 (H) 09/03/2015   ALKPHOS 51 09/03/2015   BILITOT 1.0 09/03/2015   Lab Results  Component Value Date   CHOL 240 (H) 09/03/2015   Lab Results  Component Value Date   HDL 50.80 09/03/2015   Lab Results  Component Value Date   LDLCALC 156 (H) 09/03/2015   Lab Results  Component Value Date   TRIG 168.0 (H) 09/03/2015   Lab Results  Component Value Date   CHOLHDL 5 09/03/2015    IMPRESSION AND PLAN:  1) Rash/skin eruption: resolved.  Could have come from the supplement he had been injecting recently.  He has d/c'd this and I strongly emphasized that he should avoid any such supplements in the future---either oral or IM.  2) Leukocytosis: mild increase in WBC count 3 mo ago.  No sign/symptom of infection. Repeat CBC w/diff today and do pathologist smear review.  3) Elevated AST/ALT 3 mo ago:  he has cut back on his alcohol intake but I encouraged him to cut back even more.  Given his mild hyperlipidemia, this could also be from hepatic steatosis/NASH. Recheck hepatic panel today.  An After Visit Summary was printed and given to the patient.  FOLLOW UP: Return if symptoms worsen or fail to improve.  Signed:  Santiago Bumpers, MD           11/27/2015

## 2015-11-28 LAB — HEPATIC FUNCTION PANEL
ALBUMIN: 3.9 g/dL (ref 3.6–5.1)
ALK PHOS: 46 U/L (ref 40–115)
ALT: 14 U/L (ref 9–46)
AST: 18 U/L (ref 10–40)
BILIRUBIN TOTAL: 0.8 mg/dL (ref 0.2–1.2)
Bilirubin, Direct: 0.2 mg/dL (ref <=0.2)
Indirect Bilirubin: 0.6 mg/dL (ref 0.2–1.2)
Total Protein: 6.5 g/dL (ref 6.1–8.1)

## 2015-11-28 LAB — PATHOLOGIST SMEAR REVIEW

## 2016-02-25 ENCOUNTER — Ambulatory Visit (INDEPENDENT_AMBULATORY_CARE_PROVIDER_SITE_OTHER): Payer: BLUE CROSS/BLUE SHIELD | Admitting: Family Medicine

## 2016-02-25 ENCOUNTER — Encounter: Payer: Self-pay | Admitting: Family Medicine

## 2016-02-25 VITALS — BP 151/83 | HR 114 | Temp 98.4°F | Resp 20 | Wt 199.0 lb

## 2016-02-25 DIAGNOSIS — F411 Generalized anxiety disorder: Secondary | ICD-10-CM | POA: Diagnosis not present

## 2016-02-25 MED ORDER — LORAZEPAM 0.5 MG PO TABS: 0.5000 mg | ORAL_TABLET | Freq: Every day | ORAL | 0 refills | Status: DC | PRN

## 2016-02-25 MED ORDER — DULOXETINE HCL 30 MG PO CPEP: ORAL_CAPSULE | ORAL | 0 refills | Status: DC

## 2016-02-25 NOTE — Progress Notes (Signed)
Andre Raymond , 03/14/1991, 25 y.o., male MRN: [id number] Patient Care Team    Relationship Specialty Notifications Start End  Jeoffrey MassedPhilip H McGowen, MD PCP - General Family Medicine  06/16/11     CC: Panic attack Subjective: Pt presents for an acute OV with complaints of panic attack yesterday evening while driving to the store.  Associated symptoms include heart racing, knot in stomach, shaky and a "nervous for no reason" feeling. He has had similar symptoms in the past, but no that degree. He denies use of caffeine, energy drinks or other stimulants. He states he does not think he is currently under more stress than usual,  But is in transition between jobs. He works out routinely. He has been diagnosed with GAD in the past and started on paxil 20, which he does not believe worked well for him. He has a family history of anxiety. No bipolar history in his family.   Mood disorder screen: negative  Depression screen PHQ 2/9 02/25/2016  Decreased Interest 0  Down, Depressed, Hopeless 1  PHQ - 2 Score 1  Altered sleeping 2  Tired, decreased energy 2  Change in appetite 2  Feeling bad or failure about yourself  1  Trouble concentrating 0  Moving slowly or fidgety/restless 1  Suicidal thoughts 0  PHQ-9 Score 9   GAD 7 : Generalized Anxiety Score 02/25/2016  Nervous, Anxious, on Edge 2  Control/stop worrying 2  Worry too much - different things 2  Trouble relaxing 1  Restless 1  Easily annoyed or irritable 2  Afraid - awful might happen 1  Total GAD 7 Score 11  Anxiety Difficulty Very difficult      No Known Allergies Social History  Substance Use Topics  . Smoking status: Never Smoker  . Smokeless tobacco: Never Used  . Alcohol use Yes   Past Medical History:  Diagnosis Date  . Anxiety   . GAD (generalized anxiety disorder)   . IBS (irritable bowel syndrome) 08/26/2011   Past Surgical History:  Procedure Laterality Date  . FOOT SURGERY  2004-05   Right foot--Bone spurs  [facility]   Family History  Problem Relation Age of Onset  . Hypertension Father   . Hyperlipidemia Father   . Heart disease Maternal Grandfather      Medication List    as of 02/25/2016  1:11 PM   You have not been prescribed any medications.     No results found for this or any previous visit (from the past 24 hour(s)). No results found.   ROS: Negative, with the exception of above mentioned in HPI   Objective:  BP (!) 151/83 (BP Location: Right Arm, Patient Position: Sitting, Cuff Size: Large)   Pulse (!) 114   Temp 98.4 F (36.9 C)   Resp 20   Wt 199 lb (90.3 kg)   SpO2 97%   BMI 26.99 kg/m  Body mass index is 26.99 kg/m. Gen: Afebrile. No acute distress. Nontoxic in appearance, well developed, well nourished.  HENT: AT. [place]. MMM Eyes:Pupils Equal Round Reactive to light, Extraocular movements intact,  Conjunctiva without redness, discharge or icterus. CV: RRR  Chest: CTAB, no wheeze or crackles.  Abd: Soft. NTND. BS present.  Neuro: Normal gait. PERLA. EOMi. Alert. Oriented x3  Psych: moderately anxious, psychomotor agitation present. Normal dress and demeanor. Normal speech. Normal thought content and judgment.  Assessment/Plan: Andre SpruceKenlon D Raymond is a 25 y.o. male present for acute OV for  Generalized anxiety disorder -  lengthy discussion surrounding GAD and treatment, as well tx for panic attacks. He is tired of feeling anxious and is agreeable to daily medication. It sounds like he never stayed on paxil long enough to see effect. Discussed benzo use for emergency/panic attack only and for short term until Cymbalta is able to reach therapeutic dose. Discussed it take about 6-8 weeks to feel full effects of the medication.   - pt is aware benzo is short term for emergency only.  - DULoxetine (CYMBALTA) 30 MG capsule; 30 mg QD for 7 days, then 60 mg daily  Dispense: 49 capsule; Refill: 0 - LORazepam (ATIVAN) 0.5 MG tablet; Take 1 tablet (0.5 mg total) by mouth daily  as needed for anxiety (panic attack).  Dispense: 30 tablet; Refill: 0 - F/U 4 weeks.    > 25 minutes spent with patient, >50% of time spent face to face counseling electronically signed by:  Felix Pacinienee Kameelah Minish, DO  [facility] [place]

## 2016-02-25 NOTE — Patient Instructions (Signed)
Start Cymbalta today, 1 pill daily for 7 days then increase to 60 mg daily (2 pill).  Ativan use for emergency only, short term med only Follow up in 4 weeks.   Generalized Anxiety Disorder Generalized anxiety disorder (GAD) is a mental disorder. It interferes with life functions, including relationships, work, and school. GAD is different from normal anxiety, which everyone experiences at some point in their lives in response to specific life events and activities. Normal anxiety actually helps us prepare for and get through these life events and activities. Normal anxiety goes away after the event or activity is over.  GAD causes anxiety that is not necessarily related to specific events or activities. It also causes excess anxiety in proportion to specific events or activities. The anxiety associated with GAD is also difficult to control. GAD can vary from mild to severe. People with severe GAD can have intense waves of anxiety with physical symptoms (panic attacks).  SYMPTOMS The anxiety and worry associated with GAD are difficult to control. This anxiety and worry are related to many life events and activities and also occur more days than not for 6 months or longer. People with GAD also have three or more of the following symptoms (one or more in children):  Restlessness.   Fatigue.  Difficulty concentrating.   Irritability.  Muscle tension.  Difficulty sleeping or unsatisfying sleep. DIAGNOSIS GAD is diagnosed through an assessment by your health care provider. Your health care provider will ask you questions aboutyour mood,physical symptoms, and events in your life. Your health care provider may ask you about your medical history and use of alcohol or drugs, including prescription medicines. Your health care provider may also do a physical exam and blood tests. Certain medical conditions and the use of certain substances can cause symptoms similar to those associated with GAD. Your  health care provider may refer you to a mental health specialist for further evaluation. TREATMENT The following therapies are usually used to treat GAD:   Medication. Antidepressant medication usually is prescribed for long-term daily control. Antianxiety medicines may be added in severe cases, especially when panic attacks occur.   Talk therapy (psychotherapy). Certain types of talk therapy can be helpful in treating GAD by providing support, education, and guidance. A form of talk therapy called cognitive behavioral therapy can teach you healthy ways to think about and react to daily life events and activities.  Stress managementtechniques. These include yoga, meditation, and exercise and can be very helpful when they are practiced regularly. A mental health specialist can help determine which treatment is best for you. Some people see improvement with one therapy. However, other people require a combination of therapies.   This information is not intended to replace advice given to you by your health care provider. Make sure you discuss any questions you have with your health care provider.   Document Released: 08/07/2012 Document Revised: 05/03/2014 Document Reviewed: 08/07/2012 Elsevier Interactive Patient Education [employer]2016 [employer]

## 2016-03-09 ENCOUNTER — Ambulatory Visit (INDEPENDENT_AMBULATORY_CARE_PROVIDER_SITE_OTHER): Payer: BLUE CROSS/BLUE SHIELD | Admitting: Family Medicine

## 2016-03-09 ENCOUNTER — Encounter: Payer: Self-pay | Admitting: Family Medicine

## 2016-03-09 VITALS — BP 152/83 | HR 61 | Temp 98.3°F | Resp 18 | Wt 200.1 lb

## 2016-03-09 DIAGNOSIS — T887XXA Unspecified adverse effect of drug or medicament, initial encounter: Secondary | ICD-10-CM

## 2016-03-09 DIAGNOSIS — F411 Generalized anxiety disorder: Secondary | ICD-10-CM | POA: Diagnosis not present

## 2016-03-09 DIAGNOSIS — T50905A Adverse effect of unspecified drugs, medicaments and biological substances, initial encounter: Secondary | ICD-10-CM

## 2016-03-09 MED ORDER — DULOXETINE HCL 60 MG PO CPEP: 60.0000 mg | ORAL_CAPSULE | Freq: Every day | ORAL | 6 refills | Status: DC

## 2016-03-09 NOTE — Progress Notes (Signed)
OFFICE VISIT  03/09/2016   CC:  Chief Complaint  Patient presents with  . Follow-up    hormone levels   HPI:    Patient is a 25 y.o. Caucasian male who presents for questions about otc hormone use lately. Works out with [employer], added otc hormone supplements months ago to try to get more "cut". These were Anivor, nudrol anadiol, trenbolone acetate, test enanthate. Around the week of halloween he began noting much more problems with anxiety/depression, sleep loss, occ acne, increased perspiration, ED, and decreased libido. He even had a panic attack and was seen here and started on cymbalta, which he has titrated to 60mg  qd dosing and says is helping.  He did not fill the ativan rx given that day.    Says he feels better since stopping the meds around 02/24/16, but still feels probs with ED and decreased libido.  Past Medical History:  Diagnosis Date  . Anxiety   . GAD (generalized anxiety disorder)   . IBS (irritable bowel syndrome) 08/26/2011    Past Surgical History:  Procedure Laterality Date  . FOOT SURGERY  2004-05   Right foot--Bone spurs [facility])    Outpatient Medications Prior to Visit  Medication Sig Dispense Refill  . DULoxetine (CYMBALTA) 30 MG capsule 30 mg QD for 7 days, then 60 mg daily 49 capsule 0  . LORazepam (ATIVAN) 0.5 MG tablet Take 1 tablet (0.5 mg total) by mouth daily as needed for anxiety (panic attack). (Patient not taking: Reported on 03/09/2016) 30 tablet 0   No facility-administered medications prior to visit.     No Known Allergies  ROS As per HPI  PE: Blood pressure (!) 152/83, pulse 61, temperature 98.3 F (36.8 C), temperature source Temporal, resp. rate 18, weight 200 lb 1.9 oz (90.8 kg), SpO2 97 %.  BP repeat 146/80. Gen: Alert, well appearing.  Patient is oriented to person, place, time, and situation. CV: RRR, no m/r/g.   LUNGS: CTA bilat, nonlabored resps, good aeration in all lung fields.   LABS:  Lab Results  Component Value  Date   TESTOSTERONE 410 09/03/2015    IMPRESSION AND PLAN:  1) Med side effects (OTC hormonal supplements): the ones persisting at this time are ED and decreased libido. Will give these more time to return--see how he's doing in about 1 mo.  If these sx's are still marked, then will check testost levels at that time.  2) GAD: panic attack may have been precipitated by recent otc hormone supplement side effects, but he does have GAD that I think he needs to stay on the cymbalta for.  Rx for cymbalta 60mg  qd given today.  An After Visit Summary was printed and given to the patient.  FOLLOW UP: Return in about 4 weeks (around 04/06/2016) for f/u anxiety.  Signed:  Santiago BumpersPhil Oluwadarasimi Favor, MD           03/09/2016

## 2016-03-09 NOTE — Progress Notes (Signed)
Pre visit review using our clinic review tool, if applicable. No additional management support is needed unless otherwise documented below in the visit note. 

## 2016-03-22 ENCOUNTER — Ambulatory Visit: Payer: BLUE CROSS/BLUE SHIELD | Admitting: Family Medicine

## 2016-03-27 ENCOUNTER — Other Ambulatory Visit: Payer: Self-pay | Admitting: Family Medicine

## 2016-03-27 DIAGNOSIS — F411 Generalized anxiety disorder: Secondary | ICD-10-CM

## 2016-04-06 ENCOUNTER — Ambulatory Visit (INDEPENDENT_AMBULATORY_CARE_PROVIDER_SITE_OTHER): Payer: BLUE CROSS/BLUE SHIELD | Admitting: Family Medicine

## 2016-04-06 ENCOUNTER — Encounter: Payer: Self-pay | Admitting: Family Medicine

## 2016-04-06 VITALS — BP 122/77 | HR 68 | Temp 98.7°F | Resp 16 | Ht 72.0 in | Wt 202.2 lb

## 2016-04-06 DIAGNOSIS — Q5561 Curvature of penis (lateral): Secondary | ICD-10-CM

## 2016-04-06 DIAGNOSIS — F411 Generalized anxiety disorder: Secondary | ICD-10-CM | POA: Diagnosis not present

## 2016-04-06 NOTE — Progress Notes (Signed)
OFFICE VISIT  04/06/2016   CC:  Chief Complaint  Patient presents with  . Follow-up    Anxiety   HPI:    Patient is a 25 y.o. Caucasian male who presents for 4 week f/u GAD. Also, last visit he was having some lingering ED and decreased libido that was associated with recent intake of some OTC hormone supplements he was using to augment his wt lifting workouts.  He had stopped the supplements, fortunately.  He has noted a curvature of his penis that seems to have gotten more pronounced as he has gotten older.   No pain in penis.  He can perform intercourse w/out problem, no pain.  Sexual desire/libido is intact.  He cannot feel any abnormal indurated regions of his penis.  Feels like duloxetine 60mg  qd is helping his anxiety very well.  No adverse side effects. Much more calm at home and at work.  No panic.  Past Medical History:  Diagnosis Date  . Anxiety   . GAD (generalized anxiety disorder)   . IBS (irritable bowel syndrome) 08/26/2011    Past Surgical History:  Procedure Laterality Date  . FOOT SURGERY  2004-05   Right foot--Bone spurs [facility])    Outpatient Medications Prior to Visit  Medication Sig Dispense Refill  . DULoxetine (CYMBALTA) 60 MG capsule Take 1 capsule (60 mg total) by mouth daily. 30 capsule 6  . LORazepam (ATIVAN) 0.5 MG tablet Take 1 tablet (0.5 mg total) by mouth daily as needed for anxiety (panic attack). (Patient not taking: Reported on 04/06/2016) 30 tablet 0   No facility-administered medications prior to visit.     No Known Allergies  ROS As per HPI  PE: Blood pressure 122/77, pulse 68, temperature 98.7 F (37.1 C), temperature source Oral, resp. rate 16, height 6' (1.829 m), weight 202 lb 4 oz (91.7 kg), SpO2 97 %. Gen: Alert, well appearing.  Patient is oriented to person, place, time, and situation. Genitals normal; both testes normal without tenderness, masses, hydroceles, varicoceles, erythema or swelling. Shaft normal to palpation,  with mild curvature towards the left, circumcised, meatus normal without discharge. No inguinal hernia noted. No inguinal lymphadenopathy.   LABS:  none  IMPRESSION AND PLAN:  GAD, doing very well on duloxetine 60mg  qd. Side effects from OTC hormone supplements taken a couple months ago have all worn off.   Regarding penile curvature, I reassured him that I think this is a benign variation of normal and does not appear consistent with peyronie's dz at this time. Signs/symptoms to call or return for were reviewed and pt expressed understanding.  An After Visit Summary was printed and given to the patient.  FOLLOW UP: Return in about 6 months (around 10/05/2016) for annual CPE (fasting).  Signed:  Santiago BumpersPhil Rebekha Diveley, MD           04/06/2016

## 2016-04-06 NOTE — Progress Notes (Signed)
Pre visit review using our clinic review tool, if applicable. No additional management support is needed unless otherwise documented below in the visit note. 

## 2016-04-26 DIAGNOSIS — K2920 Alcoholic gastritis without bleeding: Secondary | ICD-10-CM

## 2016-04-26 HISTORY — DX: Alcoholic gastritis without bleeding: K29.20

## 2016-06-13 ENCOUNTER — Emergency Department (HOSPITAL_COMMUNITY): Payer: BLUE CROSS/BLUE SHIELD

## 2016-06-13 ENCOUNTER — Encounter (HOSPITAL_COMMUNITY): Payer: Self-pay

## 2016-06-13 ENCOUNTER — Emergency Department (HOSPITAL_COMMUNITY)
Admission: EM | Admit: 2016-06-13 | Discharge: 2016-06-14 | Disposition: A | Discharge status: Other Acute Inpatient Hospital | Payer: BLUE CROSS/BLUE SHIELD | Attending: Emergency Medicine | Admitting: Emergency Medicine

## 2016-06-13 DIAGNOSIS — Y92009 Unspecified place in unspecified non-institutional (private) residence as the place of occurrence of the external cause: Secondary | ICD-10-CM | POA: Diagnosis not present

## 2016-06-13 DIAGNOSIS — F918 Other conduct disorders: Secondary | ICD-10-CM | POA: Insufficient documentation

## 2016-06-13 DIAGNOSIS — Z79899 Other long term (current) drug therapy: Secondary | ICD-10-CM | POA: Insufficient documentation

## 2016-06-13 DIAGNOSIS — W2201XA Walked into wall, initial encounter: Secondary | ICD-10-CM | POA: Insufficient documentation

## 2016-06-13 DIAGNOSIS — R4689 Other symptoms and signs involving appearance and behavior: Secondary | ICD-10-CM

## 2016-06-13 DIAGNOSIS — S61217A Laceration without foreign body of left little finger without damage to nail, initial encounter: Secondary | ICD-10-CM | POA: Diagnosis not present

## 2016-06-13 DIAGNOSIS — Y999 Unspecified external cause status: Secondary | ICD-10-CM | POA: Insufficient documentation

## 2016-06-13 DIAGNOSIS — S6992XA Unspecified injury of left wrist, hand and finger(s), initial encounter: Secondary | ICD-10-CM | POA: Diagnosis not present

## 2016-06-13 DIAGNOSIS — Y9389 Activity, other specified: Secondary | ICD-10-CM | POA: Insufficient documentation

## 2016-06-13 DIAGNOSIS — Z23 Encounter for immunization: Secondary | ICD-10-CM | POA: Diagnosis not present

## 2016-06-13 DIAGNOSIS — M79642 Pain in left hand: Secondary | ICD-10-CM | POA: Diagnosis not present

## 2016-06-13 LAB — CBC
HEMATOCRIT: 47.5 % (ref 39.0–52.0)
HEMOGLOBIN: 16 g/dL (ref 13.0–17.0)
MCH: 32.3 pg (ref 26.0–34.0)
MCHC: 33.7 g/dL (ref 30.0–36.0)
MCV: 96 fL (ref 78.0–100.0)
Platelets: 168 10*3/uL (ref 150–400)
RBC: 4.95 MIL/uL (ref 4.22–5.81)
RDW: 13.4 % (ref 11.5–15.5)
WBC: 6.6 10*3/uL (ref 4.0–10.5)

## 2016-06-13 LAB — ACETAMINOPHEN LEVEL: Acetaminophen (Tylenol), Serum: <10 ug/mL — ABNORMAL LOW (ref 10–30)

## 2016-06-13 LAB — RAPID URINE DRUG SCREEN, HOSP PERFORMED
AMPHETAMINES: NOT DETECTED
BARBITURATES: NOT DETECTED
Benzodiazepines: NOT DETECTED
Cocaine: NOT DETECTED
Opiates: NOT DETECTED
TETRAHYDROCANNABINOL: POSITIVE — AB

## 2016-06-13 LAB — COMPREHENSIVE METABOLIC PANEL
ALK PHOS: 71 U/L (ref 38–126)
ALT: 23 U/L (ref 17–63)
AST: 28 U/L (ref 15–41)
Albumin: 4.5 g/dL (ref 3.5–5.0)
Anion gap: 10 (ref 5–15)
BUN: 9 mg/dL (ref 6–20)
CALCIUM: 9 mg/dL (ref 8.9–10.3)
CO2: 26 mmol/L (ref 22–32)
CREATININE: 1.01 mg/dL (ref 0.61–1.24)
Chloride: 103 mmol/L (ref 101–111)
GFR calc non Af Amer: >60 mL/min (ref >60)
Glucose, Bld: 94 mg/dL (ref 65–99)
Potassium: 3.8 mmol/L (ref 3.5–5.1)
Sodium: 139 mmol/L (ref 135–145)
Total Bilirubin: 0.9 mg/dL (ref 0.3–1.2)
Total Protein: 7.8 g/dL (ref 6.5–8.1)

## 2016-06-13 LAB — ETHANOL: Alcohol, Ethyl (B): 28 mg/dL — ABNORMAL HIGH (ref <5)

## 2016-06-13 LAB — SALICYLATE LEVEL

## 2016-06-13 MED ORDER — ACETAMINOPHEN 325 MG PO TABS: 650.0000 mg | ORAL_TABLET | ORAL | Status: DC | PRN

## 2016-06-13 MED ORDER — TETANUS-DIPHTH-ACELL PERTUSSIS 5-2.5-18.5 LF-MCG/0.5 IM SUSP
0.5000 mL | Freq: Once | INTRAMUSCULAR | Status: AC
  Administered 2016-06-13: 0.5 mL via INTRAMUSCULAR
  Filled 2016-06-13: qty 0.5

## 2016-06-13 MED ORDER — ONDANSETRON HCL 4 MG PO TABS: 4.0000 mg | ORAL_TABLET | Freq: Three times a day (TID) | ORAL | Status: DC | PRN

## 2016-06-13 MED ORDER — LORAZEPAM 1 MG PO TABS: 1.0000 mg | ORAL_TABLET | Freq: Three times a day (TID) | ORAL | Status: DC | PRN

## 2016-06-13 MED ORDER — DULOXETINE HCL 30 MG PO CPEP
60.0000 mg | ORAL_CAPSULE | Freq: Every day | ORAL | Status: DC
  Administered 2016-06-13 – 2016-06-14 (×2): 60 mg via ORAL
  Filled 2016-06-13 (×2): qty 2

## 2016-06-13 NOTE — Progress Notes (Signed)
Patient has been referred to inpatient treatment at the following facilities: [facility], [facility], [address] [facility], [facility], [facility], [facility].  CSW in disposition will continue to seek placement.  [place] Abtsatia Ballard Budney, LCSWA Disposition staff 06/13/2016 3:29 PM
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
## 2016-06-13 NOTE — ED Notes (Signed)
TTS in progress 

## 2016-06-13 NOTE — ED Notes (Signed)
Soaking pt finger in water at this time, Dr. Judd Lienelo notified that pt may need stitches in left little finger.  Pt states that he does not want any sutures at this time. Dr. Judd Lienelo updated.

## 2016-06-13 NOTE — ED Notes (Signed)
Dr. Zackowski at bedside  
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
## 2016-06-13 NOTE — ED Notes (Signed)
Pt refusing meal tray at this time

## 2016-06-13 NOTE — ED Notes (Signed)
Patient moved to room 16.
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
## 2016-06-13 NOTE — ED Notes (Signed)
Patient wanded by security.  Belongings collected in bag and paced in secured lock in locker room.

## 2016-06-13 NOTE — ED Notes (Signed)
Spoke with pharmacy tech to do a med rec on pt prior to medication administration.

## 2016-06-13 NOTE — ED Notes (Signed)
Medications verified with Donnelly Angelicahristy hyatt, rn.   Medications sent with belongings and form placed in medical records.
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
## 2016-06-13 NOTE — ED Notes (Signed)
Pt given meal

## 2016-06-13 NOTE — ED Notes (Signed)
Patient wanded by security. 

## 2016-06-14 NOTE — Progress Notes (Signed)
Followed up on inpatient referrals.   Re-referred to the following: [facility]- per [facility]- per Revonda StandardAllison  Additional referrals sent to: [facility]- per [facility]ra NeerL[facility]- per Nevin Bloodgoodoni  Layci Stenglein, MSW, LCSW Clinical Social Work, Disposition  06/14/2016 [phone]

## 2016-06-14 NOTE — Progress Notes (Signed)
Pt accepted to [facility] by Dr. Estill Cottahomas Cornwall. Can arrive anytime. Report # is [id number].  Intake states pt "was accepted yesterday Sunday 2/18 and 1:21 pm and we were expecting him today." CSW reviewed chart and this information is not noted. Apologized for the delay in pt transfer.  Intake did not take name of person who they spoke with regarding this yesterday, unable to follow up re: lack of communication.  Notified [facility] of [facility].  Ilean SkillMeghan Tanay Misuraca, MSW, LCSW Clinical Social Work, Disposition  06/14/2016 [phone]

## 2016-06-14 NOTE — ED Notes (Signed)
[facility] stable and [facility].
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
## 2016-07-14 ENCOUNTER — Encounter: Payer: Self-pay | Admitting: Family Medicine

## 2016-07-14 ENCOUNTER — Ambulatory Visit: Payer: BLUE CROSS/BLUE SHIELD | Admitting: Family Medicine

## 2016-09-03 ENCOUNTER — Encounter: Payer: BLUE CROSS/BLUE SHIELD | Admitting: Family Medicine

## 2016-09-15 ENCOUNTER — Ambulatory Visit (INDEPENDENT_AMBULATORY_CARE_PROVIDER_SITE_OTHER): Payer: Self-pay | Admitting: Family Medicine

## 2016-09-15 ENCOUNTER — Encounter: Payer: Self-pay | Admitting: Family Medicine

## 2016-09-15 VITALS — BP 127/74 | HR 61 | Temp 97.9°F | Resp 16 | Wt 212.0 lb

## 2016-09-15 DIAGNOSIS — M549 Dorsalgia, unspecified: Secondary | ICD-10-CM

## 2016-09-15 DIAGNOSIS — M542 Cervicalgia: Secondary | ICD-10-CM

## 2016-09-15 DIAGNOSIS — M898X1 Other specified disorders of bone, shoulder: Secondary | ICD-10-CM

## 2016-09-15 DIAGNOSIS — W57XXXA Bitten or stung by nonvenomous insect and other nonvenomous arthropods, initial encounter: Secondary | ICD-10-CM

## 2016-09-15 MED ORDER — DOXYCYCLINE HYCLATE 100 MG PO TABS: 100.0000 mg | ORAL_TABLET | Freq: Two times a day (BID) | ORAL | 0 refills | Status: DC

## 2016-09-15 MED ORDER — CYCLOBENZAPRINE HCL 10 MG PO TABS: 10.0000 mg | ORAL_TABLET | Freq: Three times a day (TID) | ORAL | 0 refills | Status: DC | PRN

## 2016-09-15 NOTE — Patient Instructions (Signed)
Stop BC powder.  TAke ibuprofen 600 mg (3 tabs) with food three times per day for the next 5 days.  Apply a heating pad for 20-30 min once daily x 7d.  Do mid/upper back/neck range of motion stretching 1-2 times daily.

## 2016-09-15 NOTE — Progress Notes (Signed)
OFFICE VISIT  09/15/2016  CC:  Chief Complaint  Patient presents with  . Insect Bite    tick bite, fatigue, back pain   HPI:    Patient is a 26 y.o.  male who presents for concern of fatigue and back pain that he feels may be related to recent tick bites. Main concern is 6d of pain in mid back between shoulder blades and up into lower aspect of neck --some stiffness. No HA.  Getting very little sleep due to the back pain--feeling tired as a result.  NO associated tingling or numbness in extremities related to this pain.   Had tick bite 3 d/a.  Also had tick bite approx 2 wks ago.  No significant rash at the bite sites or any rash elsewhere. No fever.  Some left elbow discomfort but no swelling or redness of joints.  No melena or hematochezia.  Appetite is fair.  No n/v/d/constip.  Has chronic allergic rhinitis sx's but o/w no resp sx's.   Taking ibup and BC powder morning and night since onset.   Past Medical History:  Diagnosis Date  . Anxiety   . GAD (generalized anxiety disorder)   . IBS (irritable bowel syndrome) 08/26/2011    Past Surgical History:  Procedure Laterality Date  . FOOT SURGERY  2004-05   Right foot--Bone spurs [facility])    Outpatient Medications Prior to Visit  Medication Sig Dispense Refill  . DULoxetine (CYMBALTA) 60 MG capsule Take 1 capsule (60 mg total) by mouth daily. (Patient not taking: Reported on 09/15/2016) 30 capsule 6   No facility-administered medications prior to visit.     No Known Allergies  ROS As per HPI  PE: Blood pressure 127/74, pulse 61, temperature 97.9 F (36.6 C), temperature source Oral, resp. rate 16, weight 212 lb (96.2 kg), SpO2 99 %. Gen: Alert, well appearing.  Patient is oriented to person, place, time, and situation. AFFECT: pleasant, lucid thought and speech. ZOX:WRUE: no injection, icteris, swelling, or exudate.  EOMI, PERRLA. Mouth: lips without lesion/swelling.  Oral mucosa pink and moist. Oropharynx without  erythema, exudate, or swelling.  Neck - No masses or thyromegaly or limitation in range of motion CV: RRR, no m/r/g.   LUNGS: CTA bilat, nonlabored resps, good aeration in all lung fields. EXT: no clubbing, cyanosis, or edema.  SKIN: no rash. Back: ROM intact, some pain in L scapular region with neck ROM and mid/upper back ROM. No tenderness to palpation anywhere in the back, but L sided paraspinous and scapular soft tissues/muscles tense compared to the right side.  No neck tenderness.  ROM intact, only mild stiffness. Shoulders ROM intact w/out pain.  UE strength 5/5 prox/dist.  LABS:    Chemistry      Component Value Date/Time   NA 139 06/13/2016 1122   K 3.8 06/13/2016 1122   CL 103 06/13/2016 1122   CO2 26 06/13/2016 1122   BUN 9 06/13/2016 1122   CREATININE 1.01 06/13/2016 1122      Component Value Date/Time   CALCIUM 9.0 06/13/2016 1122   ALKPHOS 71 06/13/2016 1122   AST 28 06/13/2016 1122   ALT 23 06/13/2016 1122   BILITOT 0.9 06/13/2016 1122       IMPRESSION AND PLAN:  1) Back pain--mid/upper L scapular region. No abnormality on exam.  Suspect his fatigue/sleepiness is secondary to the much decreased sleep he is getting due to his back pain.   Hx of tick bite: these sx's started within 7d of a  tick bite. Will treat empirically for tick borne disease: doxycycline 100 mg bid x7d.  Therapeutic expectations and side effect profile of medication discussed today.  Patient's questions answered. Flexeril 10mg  tid prn, #30, no RF.  Therapeutic expectations and side effect profile of medication discussed today.  Patient's questions answered. Instructions: Stop BC powder.  TAke ibuprofen 600 mg (3 tabs) with food three times per day for the next 5 days.  Apply a heating pad for 20-30 min once daily x 7d.  Do mid/upper back/neck range of motion stretching 1-2 times daily.  Spent 25 min with pt today, with >50% of this time spent in counseling and care coordination regarding  the above problems: discussion of symptoms of tick borne illness, general treatments for musculoskeletal back pain, need (or lack thereof) for further blood or imaging testing, and medication therapeutic effects and possible adverse effects.  An After Visit Summary was printed and given to the patient.  FOLLOW UP: Return if symptoms worsen or fail to improve.  Signed:  Santiago BumpersPhil Storm Sovine, MD           09/15/2016

## 2016-10-08 ENCOUNTER — Telehealth: Payer: Self-pay | Admitting: Family Medicine

## 2016-10-08 ENCOUNTER — Ambulatory Visit (INDEPENDENT_AMBULATORY_CARE_PROVIDER_SITE_OTHER): Payer: Self-pay | Admitting: Family Medicine

## 2016-10-08 ENCOUNTER — Encounter: Payer: Self-pay | Admitting: Family Medicine

## 2016-10-08 VITALS — BP 121/69 | HR 61 | Temp 98.1°F | Resp 16 | Ht 72.0 in | Wt 202.0 lb

## 2016-10-08 DIAGNOSIS — F32A Depression, unspecified: Secondary | ICD-10-CM

## 2016-10-08 DIAGNOSIS — F419 Anxiety disorder, unspecified: Secondary | ICD-10-CM

## 2016-10-08 DIAGNOSIS — F329 Major depressive disorder, single episode, unspecified: Secondary | ICD-10-CM

## 2016-10-08 MED ORDER — CLONAZEPAM 0.5 MG PO TABS: 0.5000 mg | ORAL_TABLET | Freq: Two times a day (BID) | ORAL | 1 refills | Status: DC | PRN

## 2016-10-08 MED ORDER — CITALOPRAM HYDROBROMIDE 20 MG PO TABS: 20.0000 mg | ORAL_TABLET | Freq: Every day | ORAL | 1 refills | Status: DC

## 2016-10-08 MED ORDER — CITALOPRAM HYDROBROMIDE 20 MG PO TABS: ORAL_TABLET | ORAL | 1 refills | Status: DC

## 2016-10-08 NOTE — Telephone Encounter (Signed)
SW Dr. Milinda CaveMcGowen to clarify sig for citalopram. Per Dr. Milinda CaveMcGowen sig should have been take 1 tablet daily. [facility] advised [facility] of change.

## 2016-10-08 NOTE — Progress Notes (Signed)
OFFICE VISIT  10/08/2016   CC:  Chief Complaint  Patient presents with  . Follow-up    anxiety   HPI:    Patient is a 26 y.o. Caucasian male who presents for "nerves and anxiety".  Has a long history of this. Was on cymbalta in recent past but he stopped taking it after about 3 weeks b/c it wasn't working.  He now recalls being informed that this type of med often takes 3 weeks to begin to help. His chronic worry, irritability, feeling keyed up, restlessness, poor concentration is all persisting.  Yesterday he had a "nervous breakdown"--lots of palpitations, n/v, felt like something horrible was happening.  He felt a lot of pressure with work and having to get a lot done.  Endorses a depressed mood.  No anhedonia or crying spells.  Some sense of "blah" or hopelessness.  Poor motivation and energy level. Alcohol: a few beers on w/e, 1-2 beers during the week.  Uses marijuana occasionally. He has never been on any xanax or valium or other benzo. He took one of his mom's celexa tabs yesterday b/c he didn't know what else to do --he was having such a bad day of anxiety.  ROS: no HAs, no heat intolerance or cold intolerance, no tremor, no musculoskeletal pain, no melena or hematochezia.   Sleep is often poor.  No rash.  No unexplained fevers.   Past Medical History:  Diagnosis Date  . Anxiety   . GAD (generalized anxiety disorder)   . IBS (irritable bowel syndrome) 08/26/2011    Past Surgical History:  Procedure Laterality Date  . FOOT SURGERY  2004-05   Right foot--Bone spurs [facility])    Outpatient Medications Prior to Visit  Medication Sig Dispense Refill  . cyclobenzaprine (FLEXERIL) 10 MG tablet Take 1 tablet (10 mg total) by mouth 3 (three) times daily as needed for muscle spasms. (Patient not taking: Reported on 10/08/2016) 30 tablet 0  . doxycycline (VIBRA-TABS) 100 MG tablet Take 1 tablet (100 mg total) by mouth 2 (two) times daily. (Patient not taking: Reported on 10/08/2016) 14  tablet 0  . DULoxetine (CYMBALTA) 60 MG capsule Take 1 capsule (60 mg total) by mouth daily. (Patient not taking: Reported on 09/15/2016) 30 capsule 6   No facility-administered medications prior to visit.     No Known Allergies  ROS As per HPI  PE: Blood pressure 121/69, pulse 61, temperature 98.1 F (36.7 C), temperature source Oral, resp. rate 16, height 6' (1.829 m), weight 202 lb (91.6 kg), SpO2 97 %. Gen: Alert, well appearing.  Patient is oriented to person, place, time, and situation. AFFECT: pleasant, lucid thought and speech. No further exam today.  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 Start trial of citalopram 20mg  qd.  Therapeutic expectations and side effect profile of medication discussed  today.  Patient's questions answered. Also start clonazepam 0.5mg , 1-2 bid prn severe anxiety, #30, RF x1. Therapeutic expectations and side effect profile of medication discussed today.  Patient's questions answered. Emphasized plan to have this benzo use as short term assistance until SSRI begins to help.  An After Visit Summary was printed and given to the patient.  FOLLOW UP: Return in about 4 weeks (around 11/05/2016) for f/u anx/mood.  Signed:  Santiago BumpersPhil Rafaelita Foister, MD           10/08/2016

## 2016-10-08 NOTE — Telephone Encounter (Signed)
Andre Raymond with [facility] pharmacy requesting callback.  She needs clarification on sig for script they just received for citalopram (CELEXA) 20 MG tablet.  [facility]armacy:  [facility] [address] - [place], [place] [phone] (Phone) [phone] (Fax)

## 2016-11-02 ENCOUNTER — Encounter: Payer: Self-pay | Admitting: Family Medicine

## 2016-11-02 ENCOUNTER — Ambulatory Visit (INDEPENDENT_AMBULATORY_CARE_PROVIDER_SITE_OTHER): Payer: Self-pay | Admitting: Family Medicine

## 2016-11-02 VITALS — BP 107/65 | HR 52 | Temp 98.5°F | Resp 16 | Ht 72.0 in | Wt 195.0 lb

## 2016-11-02 DIAGNOSIS — F419 Anxiety disorder, unspecified: Secondary | ICD-10-CM

## 2016-11-02 DIAGNOSIS — F32A Depression, unspecified: Secondary | ICD-10-CM

## 2016-11-02 DIAGNOSIS — F329 Major depressive disorder, single episode, unspecified: Secondary | ICD-10-CM

## 2016-11-02 MED ORDER — CITALOPRAM HYDROBROMIDE 40 MG PO TABS: 40.0000 mg | ORAL_TABLET | Freq: Every day | ORAL | 1 refills | Status: DC

## 2016-11-02 NOTE — Progress Notes (Signed)
OFFICE VISIT  11/02/2016   CC:  Chief Complaint  Patient presents with  . Follow-up    Anxiety and Mood     HPI:    Patient is a 26 y.o. Caucasian male who presents for 4 week f/u anxiety and depression. Initiated citalopram 20mg  qd last visit, as well as clonazepam to be used prn/short term basis (0.5mg , 1-2 bid prn). Feeling good: says the anxiety/agitation/irritability/mood are all improved but not 90%. He has had to take clonazepam a few times. Things like relationships/interaction w/ people at work, social life, home life.  No side effects from meds.   Past Medical History:  Diagnosis Date  . Anxiety   . GAD (generalized anxiety disorder)   . IBS (irritable bowel syndrome) 08/26/2011    Past Surgical History:  Procedure Laterality Date  . FOOT SURGERY  2004-05   Right foot--Bone spurs [facility])    Outpatient Medications Prior to Visit  Medication Sig Dispense Refill  . citalopram (CELEXA) 20 MG tablet Take 1 tablet (20 mg total) by mouth daily. 30 tablet 1  . clonazePAM (KLONOPIN) 0.5 MG tablet Take 1 tablet (0.5 mg total) by mouth 2 (two) times daily as needed for anxiety. 30 tablet 1   No facility-administered medications prior to visit.     No Known Allergies  ROS As per HPI  PE: Blood pressure 107/65, pulse (!) 52, temperature 98.5 F (36.9 C), temperature source Oral, resp. rate 16, height 6' (1.829 m), weight 195 lb (88.5 kg), SpO2 98 %. Gen: Alert, well appearing.  Patient is oriented to person, place, time, and situation. AFFECT: pleasant, lucid thought and speech. No further exam today.  LABS:  none  IMPRESSION AND PLAN:  1) Anxiety and depression: improved but we agreed on dose increase to 40mg  citalopram qd today. Continue clonaz 0.5mg , 1-2 bid prn.  An After Visit Summary was printed and given to the patient.  FOLLOW UP: Return in about 4 weeks (around 11/30/2016) for f/u anx/mood.  Signed:  Santiago BumpersPhil McGowen, MD           11/02/2016

## 2016-11-02 NOTE — Addendum Note (Signed)
Addended by: Jeoffrey MassedMCGOWEN, Kaire Stary H on: 11/02/2016 08:49 AM   Modules accepted: Orders

## 2016-11-30 ENCOUNTER — Ambulatory Visit: Payer: Self-pay | Admitting: Family Medicine

## 2016-12-02 ENCOUNTER — Encounter: Payer: Self-pay | Admitting: Family Medicine

## 2016-12-02 ENCOUNTER — Ambulatory Visit (INDEPENDENT_AMBULATORY_CARE_PROVIDER_SITE_OTHER): Payer: Self-pay | Admitting: Family Medicine

## 2016-12-02 VITALS — BP 102/58 | HR 43 | Temp 98.5°F | Resp 16 | Ht 72.0 in | Wt 193.5 lb

## 2016-12-02 DIAGNOSIS — F419 Anxiety disorder, unspecified: Secondary | ICD-10-CM

## 2016-12-02 DIAGNOSIS — F32A Depression, unspecified: Secondary | ICD-10-CM

## 2016-12-02 DIAGNOSIS — R6882 Decreased libido: Secondary | ICD-10-CM

## 2016-12-02 DIAGNOSIS — F329 Major depressive disorder, single episode, unspecified: Secondary | ICD-10-CM

## 2016-12-02 MED ORDER — BUPROPION HCL ER (XL) 150 MG PO TB24: 150.0000 mg | ORAL_TABLET | Freq: Every day | ORAL | 1 refills | Status: DC

## 2016-12-02 MED ORDER — CITALOPRAM HYDROBROMIDE 40 MG PO TABS: ORAL_TABLET | ORAL | 1 refills | Status: DC

## 2016-12-02 NOTE — Progress Notes (Signed)
OFFICE VISIT  12/02/2016   CC:  Chief Complaint  Patient presents with  . Follow-up    Mood and Anxiety   HPI:    Patient is a 26 y.o. Caucasian male who presents for 1 mo f/u anxiety and depression. At last f/u 1 mo ago he was feeling significant improvement on 20mg  citalopram and had only had to use the clonazepam a few times.  However, since he was not 90% improved, we chose to increase citalopram to 40mg  qd.  He did not increase his citalopram to 40mg .  He stayed on the 20mg  qd dosing.  He feels like he is improved to the point of wanting to continue the 20mg  qd dosing.  Mood is stable w/out depression/sadness.  No signif irritability or anger spells. Worry is much less, concentration is improved.  Sleep and appetite are good.   Has some c/o decreased libido and some ED.  It was present a little prior to getting on citalopram but is WORSE on citalopram.     Past Medical History:  Diagnosis Date  . Anxiety   . GAD (generalized anxiety disorder)   . IBS (irritable bowel syndrome) 08/26/2011    Past Surgical History:  Procedure Laterality Date  . FOOT SURGERY  2004-05   Right foot--Bone spurs [facility]    Outpatient Medications Prior to Visit  Medication Sig Dispense Refill  . clonazePAM (KLONOPIN) 0.5 MG tablet Take 1 tablet (0.5 mg total) by mouth 2 (two) times daily as needed for anxiety. 30 tablet 1  . citalopram (CELEXA) 40 MG tablet Take 1 tablet (40 mg total) by mouth daily. (Patient not taking: Reported on 12/02/2016) 30 tablet 1   No facility-administered medications prior to visit.     No Known Allergies  ROS As per HPI  PE: Blood pressure (!) 102/58, pulse (!) 43, temperature 98.5 F (36.9 C), temperature source Oral, resp. rate 16, height 6' (1.829 m), weight 193 lb 8 oz (87.8 kg), SpO2 97 %. Gen: Alert, well appearing.  Patient is oriented to person, place, time, and situation. AFFECT: pleasant, lucid thought and speech. No further exam today.  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 Question of worsening of libido and erectile problems since getting on citalopram. Discussed options: change to different antidepressant altogether vs continue citalopram and add on wellbutrin. He chose to continue citalopram 20mg  qd and add wellbutrin xl 150mg  qd. Therapeutic expectations and side effect profile of medication discussed today.  Patient's questions answered.  2) Decreased libido + ED: see #1 above.  Also, pt asking once again for testosterone level to be checked. This was normal about 16 mo ago here. I told him the best way to check this was fasting in  the early morning, so I've ordered this test future and he'll make lab appt for future to get this done.  An After Visit Summary was printed and given to the patient.  FOLLOW UP: Return in about 6 weeks (around 01/13/2017) for f/u anxiety.  Signed:  Santiago Bumpers, MD           12/02/2016

## 2016-12-03 ENCOUNTER — Other Ambulatory Visit: Payer: Self-pay | Admitting: Family Medicine

## 2016-12-03 NOTE — Telephone Encounter (Signed)
Pt was advised at ov yesterday. This was an auto request from the pharmacy.

## 2016-12-03 NOTE — Telephone Encounter (Signed)
[facility]  RF request for clonazepam LOV: 12/02/16 Next ov: 01/13/17 Last written: 10/08/16 #30 w/ 1RF  Please advise. Thanks.

## 2016-12-03 NOTE — Telephone Encounter (Signed)
Pls notify pt he has a rx on file at the pharmacy for 40mg  citalopram---he should pick this up and take 1/2 tab per day. This will be less costly b/c the 30 pills will actually last 60d.  He also has a RF on that rx.  Clonazepam rx printed.

## 2016-12-16 ENCOUNTER — Other Ambulatory Visit: Payer: Self-pay | Admitting: Family Medicine

## 2016-12-16 NOTE — Telephone Encounter (Signed)
[facility]  Rx denied. Pt has Rx on file for 40mg  which he is to half (per Dr. Milinda Cave) if he only wants to take 20mg .

## 2017-01-13 ENCOUNTER — Ambulatory Visit: Payer: Self-pay | Admitting: Family Medicine

## 2017-01-13 DIAGNOSIS — Z0289 Encounter for other administrative examinations: Secondary | ICD-10-CM

## 2017-03-16 ENCOUNTER — Ambulatory Visit (INDEPENDENT_AMBULATORY_CARE_PROVIDER_SITE_OTHER): Payer: Self-pay | Admitting: Family Medicine

## 2017-03-16 ENCOUNTER — Other Ambulatory Visit: Payer: Self-pay | Admitting: Family Medicine

## 2017-03-16 ENCOUNTER — Encounter: Payer: Self-pay | Admitting: Family Medicine

## 2017-03-16 VITALS — BP 144/90 | HR 78 | Temp 98.2°F | Resp 16 | Wt 188.0 lb

## 2017-03-16 DIAGNOSIS — R454 Irritability and anger: Secondary | ICD-10-CM

## 2017-03-16 DIAGNOSIS — R1013 Epigastric pain: Secondary | ICD-10-CM

## 2017-03-16 DIAGNOSIS — R112 Nausea with vomiting, unspecified: Secondary | ICD-10-CM

## 2017-03-16 DIAGNOSIS — F1010 Alcohol abuse, uncomplicated: Secondary | ICD-10-CM

## 2017-03-16 DIAGNOSIS — Z8349 Family history of other endocrine, nutritional and metabolic diseases: Secondary | ICD-10-CM

## 2017-03-16 DIAGNOSIS — F411 Generalized anxiety disorder: Secondary | ICD-10-CM

## 2017-03-16 DIAGNOSIS — F330 Major depressive disorder, recurrent, mild: Secondary | ICD-10-CM

## 2017-03-16 MED ORDER — CITALOPRAM HYDROBROMIDE 40 MG PO TABS: ORAL_TABLET | ORAL | 0 refills | Status: DC

## 2017-03-16 MED ORDER — ARIPIPRAZOLE 5 MG PO TABS: 5.0000 mg | ORAL_TABLET | Freq: Every day | ORAL | 0 refills | Status: DC

## 2017-03-16 MED ORDER — PANTOPRAZOLE SODIUM 40 MG PO TBEC: 40.0000 mg | DELAYED_RELEASE_TABLET | Freq: Every day | ORAL | 3 refills | Status: DC

## 2017-03-16 NOTE — Addendum Note (Signed)
Addended by: Jeoffrey MassedMCGOWEN, PHILIP H on: 03/16/2017 02:52 PM   Modules accepted: Orders

## 2017-03-16 NOTE — Progress Notes (Addendum)
OFFICE VISIT  03/16/2017   CC:  Chief Complaint  Patient presents with  . Depression    follow up / anxiety   HPI:    Patient is a 26 y.o. Caucasian male who presents for "stomach issues" and "discuss anxiety medication". Having a lot more anxiety and depression lately, says he had a panic attack yesterday. Easily angered, irritable, lots of stomach upset with nausea and some vomiting (non-bilious.  No coffee ground emesis).  Has crying spells. Has mild anhedonia.  Poor concentration and energy level. Has been drinking 5 nights a week--12 beers a night.  Using marijuana as well.  Having arguments with family members more.  Holiday stress with family. 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 NO SI or HI.  Last time he was here we added to bupropion to help with some ED/libido impairment that may have been partially coming from citalopram.  Has still been taking citalopram 40mg , whole tab, q AM. Taking clonazepam on more of qd or bid basis the last week or so.  He knows he needs to quit drinking alcohol. He asks for screening for genetic hemachromatosis b/c his father has this.   Past Medical History:  Diagnosis Date  . GAD (generalized anxiety disorder)   . IBS (irritable bowel syndrome) 08/26/2011    Past Surgical History:  Procedure Laterality Date  . FOOT SURGERY  2004-05   Right foot--Bone spurs [facility])    Outpatient Medications Prior to Visit  Medication Sig Dispense Refill  . clonazePAM (KLONOPIN) 0.5 MG tablet TAKE 1 TABLET BY MOUTH TWICE DAILY AS NEEDED FOR ANXIETY 30 tablet 5  . citalopram (CELEXA) 40 MG tablet 1/2 tab po qd 30 tablet 1  . buPROPion (WELLBUTRIN XL) 150 MG 24 hr tablet Take 1 tablet (150 mg total) by mouth daily. (Patient not taking: Reported on 03/16/2017) 30 tablet 1   No facility-administered medications prior to visit.     No Known Allergies  ROS As per HPI  PE: Blood pressure (!) 144/90, pulse 78, temperature 98.2 F (36.8 C),  temperature source Oral, resp. rate 16, weight 188 lb (85.3 kg), SpO2 97 %. Gen: Alert, well appearing.  Patient is oriented to person, place, time, and situation. AFFECT: pleasant, lucid thought and speech. No further exam today.  LABS:  none  IMPRESSION AND PLAN:  1) MDD with significant GAD and anger control problems. He has notoriously had trouble actually taking his meds correctly/consistently. Life is hectic most of the time, seems to be getting worse. Continue citalopram 40mg  qd, add abilify 5mg  qd.  Continue clonazepam 0.5mg , 1-2 bid prn.  2) Alcohol and marijuana abuse: we discussed need to quit drinking.  He agrees, seems to have a bit of motivation to do this since he is feeling so awful lately, + girlfriend strongly encouraging him to quit. I told him marijuana is a DEPRESSANT and can actually make all his psychiatric symptoms worse, and I encouraged him to stop smoking this.  3) Dyspepsia, with nausea and vomiting---possibly full blown gastritis (alcohol).    As above, encouraged alcohol cessation. Rx'd pantoprazole 40mg  qd today.  At the end of today's visit he asked that he be screened for genetic hemachromatosis, since his father has been dx'd with this condition (HFE PCR gene test for C282Y mutation--future order already entered). We'll draw this lab at his next f/u visit in 1 mo.  An After Visit Summary was printed and given to the patient.  FOLLOW UP: Return in about 4  weeks (around 04/13/2017) for f/u anx/dep.  Signed:  Santiago BumpersPhil Pavlos Yon, MD           03/16/2017

## 2017-03-18 ENCOUNTER — Other Ambulatory Visit: Payer: Self-pay | Admitting: Family Medicine

## 2017-04-11 ENCOUNTER — Encounter: Payer: Self-pay | Admitting: Family Medicine

## 2017-04-11 ENCOUNTER — Ambulatory Visit (INDEPENDENT_AMBULATORY_CARE_PROVIDER_SITE_OTHER): Payer: Self-pay | Admitting: Family Medicine

## 2017-04-11 VITALS — BP 135/77 | HR 53 | Temp 98.3°F | Resp 16 | Ht 72.0 in | Wt 191.8 lb

## 2017-04-11 DIAGNOSIS — F1010 Alcohol abuse, uncomplicated: Secondary | ICD-10-CM

## 2017-04-11 DIAGNOSIS — Z8349 Family history of other endocrine, nutritional and metabolic diseases: Secondary | ICD-10-CM

## 2017-04-11 DIAGNOSIS — K2900 Acute gastritis without bleeding: Secondary | ICD-10-CM

## 2017-04-11 DIAGNOSIS — R454 Irritability and anger: Secondary | ICD-10-CM

## 2017-04-11 DIAGNOSIS — F320 Major depressive disorder, single episode, mild: Secondary | ICD-10-CM

## 2017-04-11 DIAGNOSIS — R6882 Decreased libido: Secondary | ICD-10-CM

## 2017-04-11 DIAGNOSIS — F411 Generalized anxiety disorder: Secondary | ICD-10-CM

## 2017-04-11 MED ORDER — ARIPIPRAZOLE 5 MG PO TABS: 5.0000 mg | ORAL_TABLET | Freq: Every day | ORAL | 1 refills | Status: DC

## 2017-04-11 MED ORDER — CITALOPRAM HYDROBROMIDE 40 MG PO TABS: ORAL_TABLET | ORAL | 1 refills | Status: DC

## 2017-04-11 NOTE — Progress Notes (Signed)
OFFICE VISIT  04/11/2017   CC:  Chief Complaint  Patient presents with  . Follow-up    Anxiety  . Fatigue    wants testosterone and Vit B12 checked   HPI:    Patient is a 26 y.o. Caucasian male who presents for f/u anxiety with MDD and significant anger control problems. I saw him about 2 weeks ago, and at that time I added 5mg  abilify to his citalopram 40mg  qd.  Continued him on clonazepam 0.5mg , 1-2 bid prn. We discussed cessation of alcohol and marijuana use as well. Seems to be much improved: much less anxiety and depressed mood.   Notes question of light sleep and some sweating at night.  He is tolerating this well.  He also had alcoholic gastritis at that time.  I rx'd pantoprazole and emphasized the importance of alcohol cessation. He is feeling MUCH improved on this med.  Says stomach feels amazing now---no more morning nausea/vomiting.  He is concerned about his erections not lasting very long--says this is impacting his relationship with GF. Has been going on for almost a year now.  Says his libido is intact, then goes on to describe how he sometimes is not interested in sex. He has worked hard lately on cutting back on alcohol--still drinking a few beers several times a week and occ 6-12 at a party. Has not smoked marijuana in 5 days now.  Past Medical History:  Diagnosis Date  . GAD (generalized anxiety disorder)   . IBS (irritable bowel syndrome) 08/26/2011    Past Surgical History:  Procedure Laterality Date  . FOOT SURGERY  2004-05   Right foot--Bone spurs [facility]    Outpatient Medications Prior to Visit  Medication Sig Dispense Refill  . ARIPiprazole (ABILIFY) 5 MG tablet Take 1 tablet (5 mg total) by mouth daily. 30 tablet 0  . citalopram (CELEXA) 40 MG tablet TAKE 1 TABLET BY MOUTH ONCE DAILY DOSE  INCREASE 30 tablet 1  . clonazePAM (KLONOPIN) 0.5 MG tablet TAKE 1 TABLET BY MOUTH TWICE DAILY AS NEEDED FOR ANXIETY 30 tablet 5  . pantoprazole (PROTONIX) 40  MG tablet Take 1 tablet (40 mg total) by mouth daily. 30 tablet 3  . buPROPion (WELLBUTRIN XL) 150 MG 24 hr tablet Take 1 tablet (150 mg total) by mouth daily. (Patient not taking: Reported on 03/16/2017) 30 tablet 1  . citalopram (CELEXA) 40 MG tablet 1 tab po qd (Patient not taking: Reported on 04/11/2017) 30 tablet 0   No facility-administered medications prior to visit.     No Known Allergies  ROS As per HPI  PE: Blood pressure 135/77, pulse (!) 53, temperature 98.3 F (36.8 C), temperature source Oral, resp. rate 16, height 6' (1.829 m), weight 191 lb 12 oz (87 kg), SpO2 99 %. Gen: Alert, well appearing.  Patient is oriented to person, place, time, and situation. AFFECT: pleasant, lucid thought and speech. No further exam today.  LABS:  Lab Results  Component Value Date   TESTOSTERONE 410 09/03/2015    IMPRESSION AND PLAN:  1) MDD, with GAD and anger control problems: Much improved since recent addition of 5mg  qd abilify. The current medical regimen is effective;  continue present plan and medications.  2) Acute gastritis: resolved with qd pantoprazole.  The current medical regimen is effective;  continue present plan and medications.  3) alcohol abuse: cutting back, needs to continue cutting back. He has quit marijuana x 5d, encouraged him to NOT restart.  4) ED, question of  impaired libido.  Pt asks for testosterone level check today so I ordered this.  5) Family hx of hemochromatosis (father): will do screening blood test for this today: hemochromatosis DNA-PCR (c282y,h63d).  An After Visit Summary was printed and given to the patient.  FOLLOW UP: Return in about 2 months (around 06/12/2017) for f/u dep/anx.  Signed:  Santiago BumpersPhil McGowen, MD           04/11/2017

## 2017-04-14 ENCOUNTER — Encounter: Payer: Self-pay | Admitting: Family Medicine

## 2017-04-14 LAB — TESTOSTERONE TOTAL,FREE,BIO, MALES
Albumin: 4.9 g/dL (ref 3.6–5.1)
Sex Hormone Binding: 31 nmol/L (ref 10–50)
TESTOSTERONE FREE: 81 pg/mL (ref 46.0–224.0)
TESTOSTERONE: 573 ng/dL (ref 250–827)
Testosterone, Bioavailable: 180.8 ng/dL (ref >=110.0)

## 2017-04-14 LAB — HEMOCHROMATOSIS DNA-PCR(C282Y,H63D)

## 2017-04-21 ENCOUNTER — Other Ambulatory Visit: Payer: Self-pay | Admitting: Family Medicine

## 2017-04-21 ENCOUNTER — Telehealth: Payer: Self-pay | Admitting: Family Medicine

## 2017-04-21 NOTE — Telephone Encounter (Signed)
Pt. Requesting Klonopin refill.

## 2017-04-21 NOTE — Telephone Encounter (Signed)
Pt returning call from office .Marland Kitchen.Marland Kitchen.'Heather'.  Advised Rx for Klonopin was ordered and filled as requested.

## 2017-04-21 NOTE — Telephone Encounter (Signed)
Copied from CRM [id number]. Topic: Quick Communication - See Telephone Encounter >> Apr 21, 2017  9:21 AM Jolayne Hainesaylor, Brittany L wrote: CRM for notification. See Telephone encounter for:   04/21/17.  Pt said he went to get his KLONOPIN at the pharmacy yesterday & they told him that it couldn't be filled again until 1/10. He said he had picked up it on 12/10. He said him and Dr Milinda CaveMcGowen talked about this at his last appt & he said this was okay because he is taking 2 pills a a day for anxiety. [facility].

## 2017-04-21 NOTE — Telephone Encounter (Signed)
SW Mindi [facility]unk[facility]Marsha at [facility] and she stated that pt tried to fill Rx on Christmas Eve (04/18/17) and request was too early. She ran request again while I was on the phone with her and it went through, they will get Rx ready for pt.   Left message for pt to call back.  Okay for [facility] to advise pt.

## 2017-04-25 NOTE — Telephone Encounter (Signed)
Pt advised see phone note dated 04/21/17.

## 2017-06-08 ENCOUNTER — Other Ambulatory Visit: Payer: Self-pay | Admitting: Family Medicine

## 2017-06-10 ENCOUNTER — Ambulatory Visit: Payer: Self-pay | Admitting: Family Medicine

## 2017-06-10 ENCOUNTER — Encounter: Payer: Self-pay | Admitting: Family Medicine

## 2017-06-10 DIAGNOSIS — Z0289 Encounter for other administrative examinations: Secondary | ICD-10-CM

## 2017-06-10 NOTE — Progress Notes (Deleted)
OFFICE VISIT  06/10/2017   CC: No chief complaint on file.    HPI:    Patient is a 27 y.o. Caucasian male who presents for 2 mo f/u MDD, anxiety, and anger control problem. At last f/u visit he was improved after recent addition of abilify 5mg  qd to his citalopram 40 mg qd and clonazepam 0.5mg  bid prn.  Past Medical History:  Diagnosis Date  . Family history of hemochromatosis    Genetic testing of this pt was NEG 03/2017.  Marland Kitchen. GAD (generalized anxiety disorder)   . IBS (irritable bowel syndrome) 08/26/2011    Past Surgical History:  Procedure Laterality Date  . FOOT SURGERY  2004-05   Right foot--Bone spurs [facility])    Outpatient Medications Prior to Visit  Medication Sig Dispense Refill  . ARIPiprazole (ABILIFY) 5 MG tablet Take 1 tablet (5 mg total) by mouth daily. 30 tablet 1  . citalopram (CELEXA) 40 MG tablet TAKE 1 TABLET BY MOUTH ONCE DAILY DOSE  INCREASE 30 tablet 1  . clonazePAM (KLONOPIN) 0.5 MG tablet TAKE 1 TABLET BY MOUTH TWICE DAILY AS NEEDED FOR  ANXIETY 30 tablet 5  . pantoprazole (PROTONIX) 40 MG tablet Take 1 tablet (40 mg total) by mouth daily. 30 tablet 3   No facility-administered medications prior to visit.     No Known Allergies  ROS As per HPI  PE: There were no vitals taken for this visit. ***  LABS:  ***  IMPRESSION AND PLAN:  No problem-specific Assessment & Plan notes found for this encounter.   FOLLOW UP: No Follow-up on file.

## 2017-07-12 ENCOUNTER — Ambulatory Visit (INDEPENDENT_AMBULATORY_CARE_PROVIDER_SITE_OTHER): Payer: Self-pay | Admitting: Family Medicine

## 2017-07-12 ENCOUNTER — Encounter: Payer: Self-pay | Admitting: Family Medicine

## 2017-07-12 VITALS — BP 130/78 | HR 80 | Temp 98.3°F | Resp 16 | Ht 72.0 in | Wt 219.0 lb

## 2017-07-12 DIAGNOSIS — F411 Generalized anxiety disorder: Secondary | ICD-10-CM

## 2017-07-12 DIAGNOSIS — F320 Major depressive disorder, single episode, mild: Secondary | ICD-10-CM

## 2017-07-12 DIAGNOSIS — R454 Irritability and anger: Secondary | ICD-10-CM

## 2017-07-12 DIAGNOSIS — Z79899 Other long term (current) drug therapy: Secondary | ICD-10-CM

## 2017-07-12 DIAGNOSIS — R41840 Attention and concentration deficit: Secondary | ICD-10-CM

## 2017-07-12 DIAGNOSIS — R4587 Impulsiveness: Secondary | ICD-10-CM

## 2017-07-12 DIAGNOSIS — F1010 Alcohol abuse, uncomplicated: Secondary | ICD-10-CM

## 2017-07-12 MED ORDER — QUETIAPINE FUMARATE 25 MG PO TABS: 25.0000 mg | ORAL_TABLET | Freq: Two times a day (BID) | ORAL | 1 refills | Status: DC

## 2017-07-12 MED ORDER — FLUTICASONE PROPIONATE 50 MCG/ACT NA SUSP: 2.0000 | Freq: Every day | NASAL | 6 refills | Status: DC

## 2017-07-12 MED ORDER — FEXOFENADINE HCL 180 MG PO TABS: 180.0000 mg | ORAL_TABLET | Freq: Every day | ORAL | 3 refills | Status: DC

## 2017-07-12 NOTE — Progress Notes (Signed)
OFFICE VISIT  07/12/2017   CC:  Chief Complaint  Patient presents with  . Allergic Rhinitis    HPI:    Patient is a 27 y.o. Caucasian male who presents for 3 mo f/u MDD, GAD, and wants to discuss allergy symptoms. Most recent addition to his med regimen is abilify, and at last f/u visit he was feeling improved regarding depression, irritability, anxiety levels, anger levels, impulsivity, negative thinking. Says he is doing well, main symptom is generalized fatigue and difficulty focusing/concentrating.  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   States his mood and anxiety have gradually continued to improve. He states today that he cannot afford to take the abilify anymore, asks for alternative.  He has no health insurance. Use of clonazepam: he is trying to use this only prn, whereas he used to take this daily. Alcohol abuse in the past: continues to cut back. Marijuana abuse in the past: still uses about 2 times per week.  More trouble with nasal congestion, sneezing, runny nose.  Sx's worse in mornings. Tried zyrtec and allegra in the past. No wheezing or coughing or sT.  NO face pain.  Occ eyes itching but nothing persistent, and no drainage or swelling of eyes.  Past Medical History:  Diagnosis Date  . Alcohol abuse   . Alcoholic gastritis 2018  . Family history of hemochromatosis    Genetic testing of this pt was NEG 03/2017.  Marland Kitchen. GAD (generalized anxiety disorder)   . IBS (irritable bowel syndrome) 08/26/2011    Past Surgical History:  Procedure Laterality Date  . FOOT SURGERY  2004-05   Right foot--Bone spurs [facility]    Outpatient Medications Prior to Visit  Medication Sig Dispense Refill  . citalopram (CELEXA) 40 MG tablet TAKE 1 TABLET BY MOUTH ONCE DAILY DOSE  INCREASE 30 tablet 1  . clonazePAM (KLONOPIN) 0.5 MG tablet TAKE 1 TABLET BY MOUTH TWICE DAILY AS  NEEDED FOR  ANXIETY 30 tablet 5  . pantoprazole (PROTONIX) 40 MG tablet Take 1 tablet (40 mg total) by mouth daily. 30 tablet 3  . ARIPiprazole (ABILIFY) 5 MG tablet Take 1 tablet (5 mg total) by mouth daily. 30 tablet 1   No facility-administered medications prior to visit.     No Known Allergies  ROS As per HPI  PE: Blood pressure 130/78, pulse 80, temperature 98.3 F (36.8 C), temperature source Oral, resp. rate 16, height 6' (1.829 m), weight 219 lb (99.3 kg), SpO2 96 %. VS: noted--normal. Gen: alert, NAD, NONTOXIC APPEARING. HEENT: eyes without injection, drainage, or swelling.  Ears: EACs clear, TMs with normal light reflex and landmarks.  Nose: Clear rhinorrhea, with some dried, crusty exudate adherent to mildly injected mucosa.  No purulent d/c.  No paranasal sinus TTP.  No facial swelling.  Throat and mouth without focal lesion.  No pharyngial swelling, erythema, or exudate.   Neck: supple, no LAD.   LUNGS: CTA bilat, nonlabored resps.   CV: RRR, no m/r/g. EXT: no c/c/e SKIN: no rash  LABS:    Chemistry      Component Value Date/Time   NA 139 06/13/2016 1122   K 3.8 06/13/2016 1122   CL 103 06/13/2016 1122   CO2 26 06/13/2016 1122   BUN 9 06/13/2016 1122   CREATININE 1.01 06/13/2016 1122      Component Value Date/Time   CALCIUM 9.0 06/13/2016 1122  ALKPHOS 71 06/13/2016 1122   AST 28 06/13/2016 1122   ALT 23 06/13/2016 1122   BILITOT 0.9 06/13/2016 1122     Lab Results  Component Value Date   CHOL 240 (H) 09/03/2015   HDL 50.80 09/03/2015   LDLCALC 156 (H) 09/03/2015   TRIG 168.0 (H) 09/03/2015   CHOLHDL 5 09/03/2015    IMPRESSION AND PLAN:  1) MDD, GAD, impulse control and anger control problems. Improved on regimen of abilify, citalopram, and clonaz prn.  However, abilify is cost prohibitive so I'll try switch to generic seroquel---low dose in order to not make his fatigue worse--25mg  bid. Regarding his use of clonaz: Controlled substance contract  reviewed with patient today.  Patient signed this and it will be placed in the chart.  Emphasized the need for him to STOP use of marijuana or I will not be able to continue prescribing him any controlled substances.  He expressed understanding. Will do UDS at f/u in 1 mo. Regarding his c/o focus/concentration problems, I am not sure if this is a symptom of his anxiety d/o or if he has adult ADD. I referred him to [facility] today for further evaluation of this.  2) Allergic rhinitis: dust on farm, pollen, etc. Start allegra 180mg  daily and flonase DAILY--emphasized to pt that this will not be effective if used only prn.  An After Visit Summary was printed and given to the patient.  FOLLOW UP: Return in about 4 weeks (around 08/09/2017) for f/u anxiety/mood.  Signed:  Santiago Bumpers, MD           07/12/2017

## 2017-07-29 ENCOUNTER — Encounter: Payer: Self-pay | Admitting: Family Medicine

## 2017-08-10 ENCOUNTER — Ambulatory Visit: Payer: Self-pay | Admitting: Family Medicine

## 2017-08-10 NOTE — Progress Notes (Deleted)
OFFICE VISIT  08/10/2017   CC: No chief complaint on file.    HPI:    Patient is a 27 y.o. Caucasian male who presents for 1 mo f/u GAD and depression. Last visit: changed abilify to seroquel 25mg  bid for cost reasons.  Continued cital and clonaz at same doses. [facility] for eval for possible Adult ADD.   Past Medical History:  Diagnosis Date  . Alcohol abuse   . Alcoholic gastritis 2018  . Family history of hemochromatosis    Genetic testing of this pt was NEG 03/2017.  Marland Kitchen. GAD (generalized anxiety disorder)   . IBS (irritable bowel syndrome) 08/26/2011    Past Surgical History:  Procedure Laterality Date  . FOOT SURGERY  2004-05   Right foot--Bone spurs [facility])    Outpatient Medications Prior to Visit  Medication Sig Dispense Refill  . citalopram (CELEXA) 40 MG tablet TAKE 1 TABLET BY MOUTH ONCE DAILY DOSE  INCREASE 30 tablet 1  . clonazePAM (KLONOPIN) 0.5 MG tablet TAKE 1 TABLET BY MOUTH TWICE DAILY AS NEEDED FOR  ANXIETY 30 tablet 5  . fexofenadine (ALLEGRA) 180 MG tablet Take 1 tablet (180 mg total) by mouth daily. 30 tablet 3  . fluticasone (FLONASE) 50 MCG/ACT nasal spray Place 2 sprays into both nostrils daily. 16 g 6  . pantoprazole (PROTONIX) 40 MG tablet Take 1 tablet (40 mg total) by mouth daily. 30 tablet 3  . QUEtiapine (SEROQUEL) 25 MG tablet Take 1 tablet (25 mg total) by mouth 2 (two) times daily. 60 tablet 1   No facility-administered medications prior to visit.     No Known Allergies  ROS As per HPI  PE: There were no vitals taken for this visit. ***  LABS:    Chemistry      Component Value Date/Time   NA 139 06/13/2016 1122   K 3.8 06/13/2016 1122   CL 103 06/13/2016 1122   CO2 26 06/13/2016 1122   BUN 9 06/13/2016 1122   CREATININE 1.01 06/13/2016 1122      Component Value Date/Time   CALCIUM 9.0 06/13/2016 1122   ALKPHOS 71 06/13/2016 1122   AST 28 06/13/2016 1122   ALT 23 06/13/2016 1122   BILITOT 0.9  06/13/2016 1122      IMPRESSION AND PLAN:  No problem-specific Assessment & Plan notes found for this encounter.  UDS today.  An After Visit Summary was printed and given to the patient.  FOLLOW UP: No follow-ups on file.  Signed:  Santiago BumpersPhil McGowen, MD           08/10/2017

## 2017-08-15 ENCOUNTER — Encounter: Payer: Self-pay | Admitting: Family Medicine

## 2017-09-10 IMAGING — DX DG HAND COMPLETE 3+V*L*
3 series · 3 of 3 positions shown · non-contrast
Comparison: None.

CLINICAL DATA: Pain after hitting wall

EXAM:
LEFT HAND - COMPLETE 3+ VIEW

[hand pa]
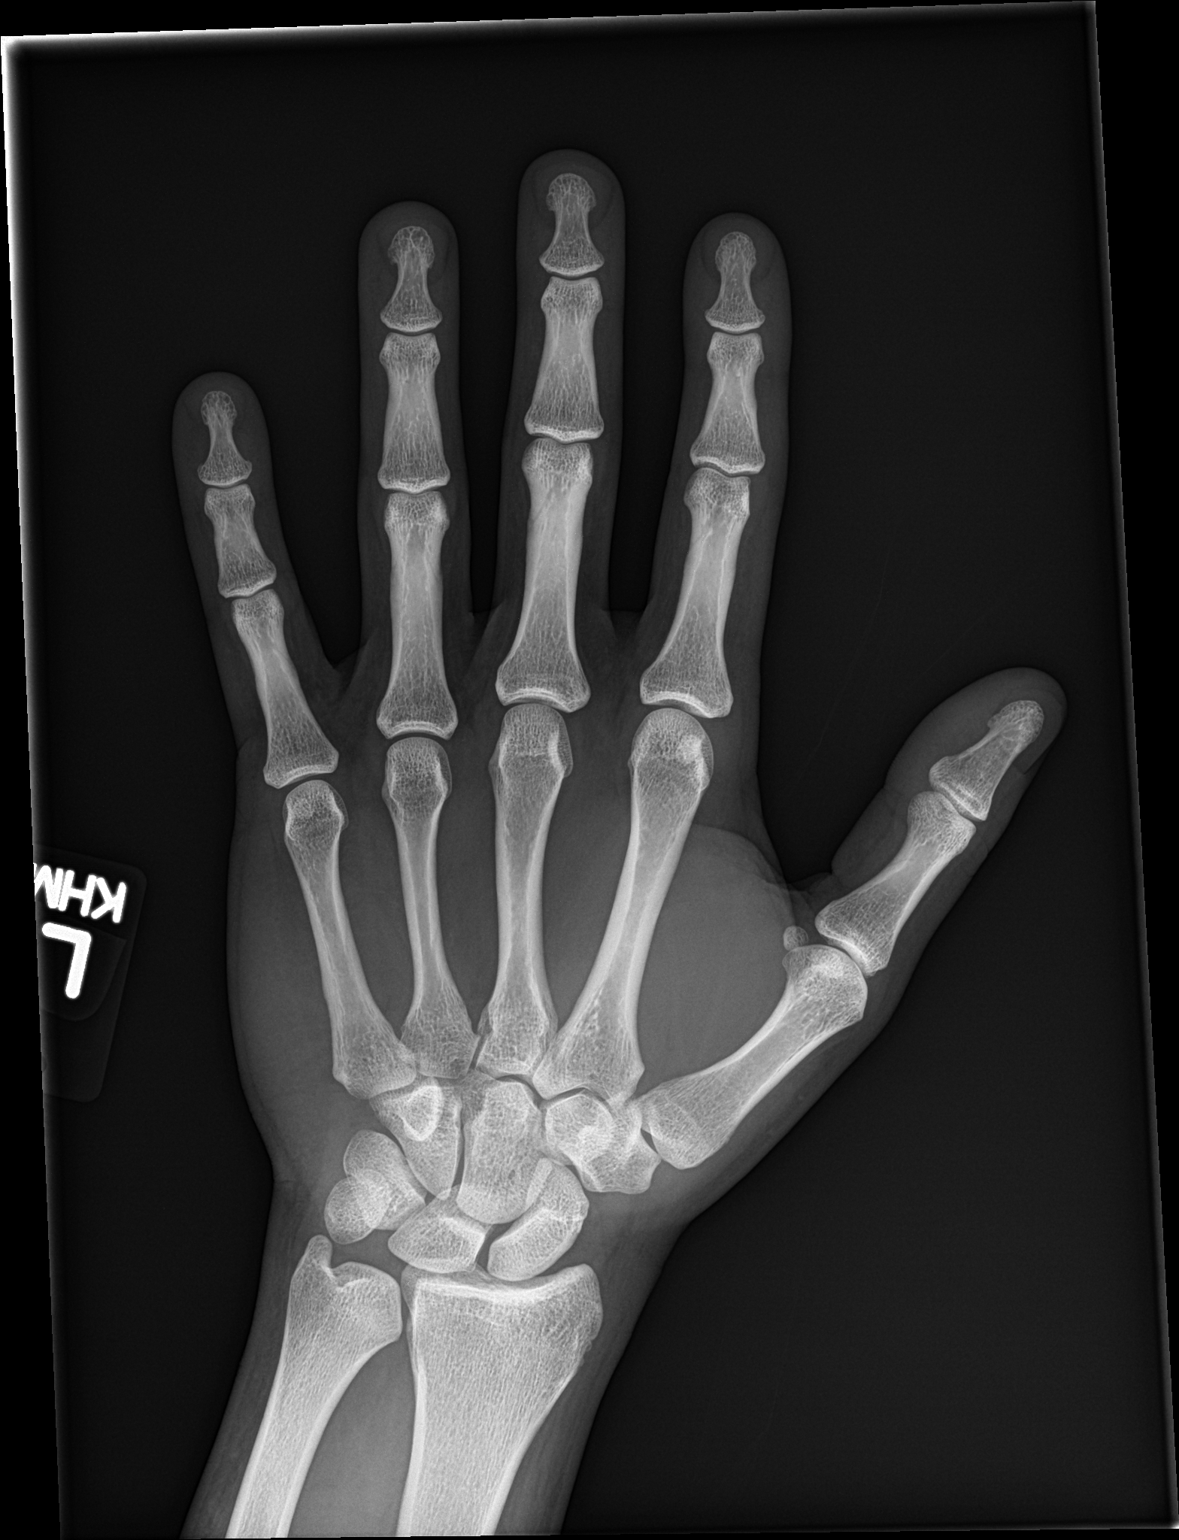

[hand obl]
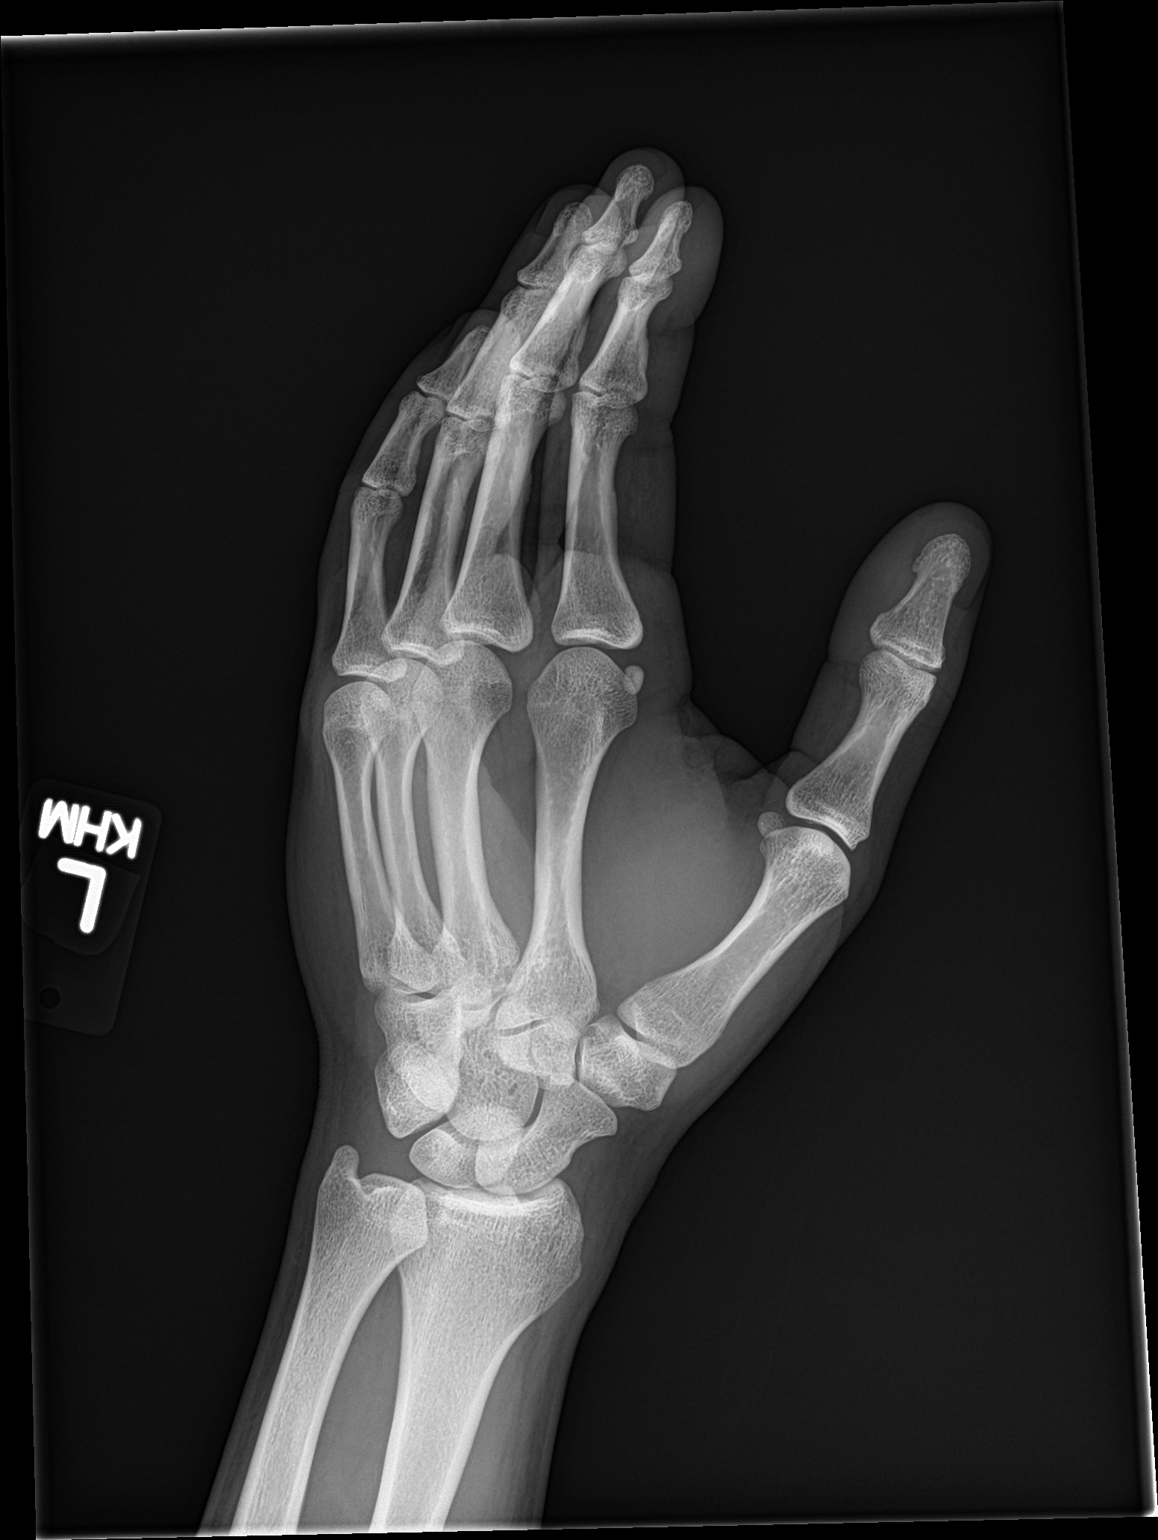

[hand lat]
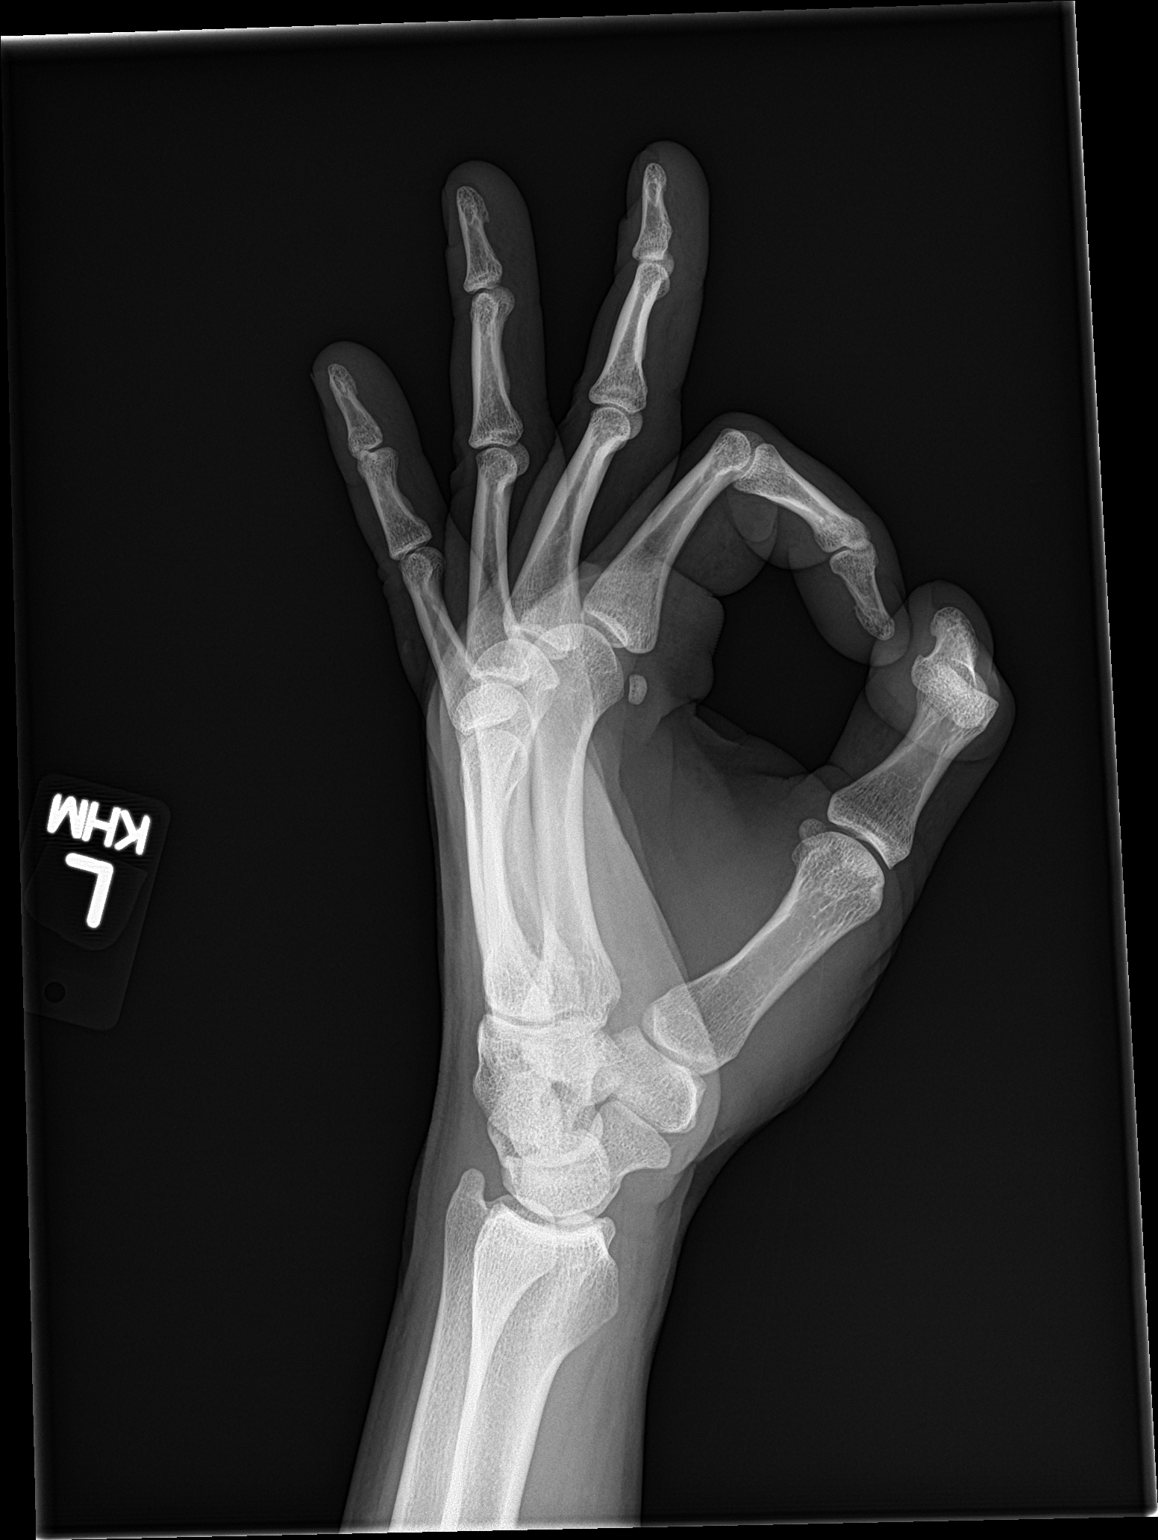

[3 of 3 positions shown; findings below may reference images not displayed]

FINDINGS: Frontal, oblique, and lateral views obtained. There is no fracture
or dislocation. Joint spaces appear normal. No erosive change.
IMPRESSION: No fracture or dislocation.  No evident arthropathy.

## 2018-04-27 DIAGNOSIS — M79645 Pain in left finger(s): Secondary | ICD-10-CM | POA: Diagnosis not present

## 2018-04-27 DIAGNOSIS — Z5189 Encounter for other specified aftercare: Secondary | ICD-10-CM | POA: Diagnosis not present

## 2018-05-09 DIAGNOSIS — R3915 Urgency of urination: Secondary | ICD-10-CM | POA: Diagnosis not present

## 2018-05-09 DIAGNOSIS — R5382 Chronic fatigue, unspecified: Secondary | ICD-10-CM | POA: Diagnosis not present

## 2018-05-09 DIAGNOSIS — Z0000 Encounter for general adult medical examination without abnormal findings: Secondary | ICD-10-CM | POA: Diagnosis not present

## 2018-05-09 DIAGNOSIS — Z13220 Encounter for screening for lipoid disorders: Secondary | ICD-10-CM | POA: Diagnosis not present

## 2018-05-09 DIAGNOSIS — K219 Gastro-esophageal reflux disease without esophagitis: Secondary | ICD-10-CM | POA: Insufficient documentation

## 2018-05-09 DIAGNOSIS — Z23 Encounter for immunization: Secondary | ICD-10-CM | POA: Diagnosis not present

## 2018-05-10 DIAGNOSIS — M25642 Stiffness of left hand, not elsewhere classified: Secondary | ICD-10-CM | POA: Diagnosis not present

## 2018-05-31 DIAGNOSIS — F411 Generalized anxiety disorder: Secondary | ICD-10-CM | POA: Diagnosis not present

## 2018-05-31 DIAGNOSIS — E291 Testicular hypofunction: Secondary | ICD-10-CM | POA: Diagnosis not present

## 2018-06-20 DIAGNOSIS — M25642 Stiffness of left hand, not elsewhere classified: Secondary | ICD-10-CM | POA: Diagnosis not present

## 2018-06-28 DIAGNOSIS — F411 Generalized anxiety disorder: Secondary | ICD-10-CM | POA: Diagnosis not present

## 2018-06-28 DIAGNOSIS — R0681 Apnea, not elsewhere classified: Secondary | ICD-10-CM | POA: Diagnosis not present

## 2018-06-28 DIAGNOSIS — R0683 Snoring: Secondary | ICD-10-CM | POA: Diagnosis not present

## 2018-07-08 DIAGNOSIS — H40033 Anatomical narrow angle, bilateral: Secondary | ICD-10-CM | POA: Diagnosis not present

## 2018-07-08 DIAGNOSIS — H04123 Dry eye syndrome of bilateral lacrimal glands: Secondary | ICD-10-CM | POA: Diagnosis not present

## 2018-10-03 DIAGNOSIS — E291 Testicular hypofunction: Secondary | ICD-10-CM | POA: Insufficient documentation

## 2018-10-03 DIAGNOSIS — W57XXXA Bitten or stung by nonvenomous insect and other nonvenomous arthropods, initial encounter: Secondary | ICD-10-CM | POA: Diagnosis not present

## 2018-10-03 DIAGNOSIS — F411 Generalized anxiety disorder: Secondary | ICD-10-CM | POA: Diagnosis not present

## 2018-10-03 DIAGNOSIS — S80861A Insect bite (nonvenomous), right lower leg, initial encounter: Secondary | ICD-10-CM | POA: Diagnosis not present

## 2018-11-24 DIAGNOSIS — K529 Noninfective gastroenteritis and colitis, unspecified: Secondary | ICD-10-CM | POA: Diagnosis not present

## 2018-11-24 DIAGNOSIS — F411 Generalized anxiety disorder: Secondary | ICD-10-CM | POA: Diagnosis not present

## 2019-06-06 DIAGNOSIS — F1010 Alcohol abuse, uncomplicated: Secondary | ICD-10-CM | POA: Insufficient documentation

## 2020-11-12 HISTORY — PX: COLONOSCOPY W/ BIOPSIES: SHX1374

## 2021-07-05 ENCOUNTER — Encounter (HOSPITAL_BASED_OUTPATIENT_CLINIC_OR_DEPARTMENT_OTHER): Payer: Self-pay | Admitting: Obstetrics and Gynecology

## 2021-07-05 ENCOUNTER — Emergency Department (HOSPITAL_BASED_OUTPATIENT_CLINIC_OR_DEPARTMENT_OTHER)
Admission: EM | Admit: 2021-07-05 | Discharge: 2021-07-05 | Disposition: A | Discharge status: Home / Self Care | Payer: BC Managed Care – PPO | Attending: Emergency Medicine | Admitting: Emergency Medicine

## 2021-07-05 ENCOUNTER — Other Ambulatory Visit: Payer: Self-pay

## 2021-07-05 DIAGNOSIS — A084 Viral intestinal infection, unspecified: Secondary | ICD-10-CM | POA: Diagnosis not present

## 2021-07-05 DIAGNOSIS — R197 Diarrhea, unspecified: Secondary | ICD-10-CM | POA: Diagnosis present

## 2021-07-05 DIAGNOSIS — D72829 Elevated white blood cell count, unspecified: Secondary | ICD-10-CM | POA: Diagnosis not present

## 2021-07-05 LAB — COMPREHENSIVE METABOLIC PANEL
ALT: 31 U/L (ref 0–44)
AST: 22 U/L (ref 15–41)
Albumin: 5.5 g/dL — ABNORMAL HIGH (ref 3.5–5.0)
Alkaline Phosphatase: 87 U/L (ref 38–126)
Anion gap: 16 — ABNORMAL HIGH (ref 5–15)
BUN: 17 mg/dL (ref 6–20)
CO2: 27 mmol/L (ref 22–32)
Calcium: 10.6 mg/dL — ABNORMAL HIGH (ref 8.9–10.3)
Chloride: 101 mmol/L (ref 98–111)
Creatinine, Ser: 1.2 mg/dL (ref 0.61–1.24)
GFR, Estimated: >60 mL/min (ref >60)
Glucose, Bld: 123 mg/dL — ABNORMAL HIGH (ref 70–99)
Potassium: 3.6 mmol/L (ref 3.5–5.1)
Sodium: 144 mmol/L (ref 135–145)
Total Bilirubin: 0.7 mg/dL (ref 0.3–1.2)
Total Protein: 9 g/dL — ABNORMAL HIGH (ref 6.5–8.1)

## 2021-07-05 LAB — MAGNESIUM: Magnesium: 1.7 mg/dL (ref 1.7–2.4)

## 2021-07-05 LAB — CBC
HCT: 46.2 % (ref 39.0–52.0)
Hemoglobin: 16.1 g/dL (ref 13.0–17.0)
MCH: 32.3 pg (ref 26.0–34.0)
MCHC: 34.8 g/dL (ref 30.0–36.0)
MCV: 92.6 fL (ref 80.0–100.0)
Platelets: 233 10*3/uL (ref 150–400)
RBC: 4.99 MIL/uL (ref 4.22–5.81)
RDW: 12.2 % (ref 11.5–15.5)
WBC: 17.5 10*3/uL — ABNORMAL HIGH (ref 4.0–10.5)
nRBC: 0 % (ref 0.0–0.2)

## 2021-07-05 LAB — LIPASE, BLOOD: Lipase: 30 U/L (ref 11–51)

## 2021-07-05 MED ORDER — ONDANSETRON 4 MG PO TBDP: 4.0000 mg | ORAL_TABLET | Freq: Three times a day (TID) | ORAL | 0 refills | Status: DC | PRN

## 2021-07-05 MED ORDER — ONDANSETRON HCL 4 MG/2ML IJ SOLN
4.0000 mg | Freq: Once | INTRAMUSCULAR | Status: AC | PRN
  Administered 2021-07-05: 4 mg via INTRAVENOUS
  Filled 2021-07-05: qty 2

## 2021-07-05 NOTE — ED Provider Notes (Cosign Needed)
[facility] EMERGENCY DEPT Provider Note   CSN: [id number] Arrival date & time: 07/05/21  1703     History  Chief Complaint  Patient presents with   Abdominal Pain    Andre Raymond is a 31 y.o. male.  31 year old male presents to the emergency department for evaluation of nausea, vomiting, diarrhea which began at 4 AM.  Reports too numerous to count episodes of vomiting associated with his symptoms.  Diarrhea has been watery, nonbloody.  Did report some abdominal pain in triage, but denies any abdominal pain at this time.  He has no history of prior abdominal surgeries.  Was unable to tolerate any food or fluids prior to arrival without emesis.  No known sick contacts or associated fevers.  The history is provided by the patient. No language interpreter was used.  Abdominal Pain     Home Medications Prior to Admission medications   Medication Sig Start Date End Date Taking? Authorizing Provider  ondansetron (ZOFRAN-ODT) 4 MG disintegrating tablet Take 1 tablet (4 mg total) by mouth every 8 (eight) hours as needed for nausea or vomiting. 07/05/21  Yes Antonietta Breach, PA-C  citalopram (CELEXA) 40 MG tablet TAKE 1 TABLET BY MOUTH ONCE DAILY DOSE  INCREASE 04/11/17   McGowen, Adrian Blackwater, MD  clonazePAM (KLONOPIN) 0.5 MG tablet TAKE 1 TABLET BY MOUTH TWICE DAILY AS NEEDED FOR  ANXIETY 06/08/17   McGowen, Adrian Blackwater, MD  fexofenadine (ALLEGRA) 180 MG tablet Take 1 tablet (180 mg total) by mouth daily. 07/12/17 07/12/18  McGowen, Adrian Blackwater, MD  fluticasone (FLONASE) 50 MCG/ACT nasal spray Place 2 sprays into both nostrils daily. 07/12/17   McGowen, Adrian Blackwater, MD  pantoprazole (PROTONIX) 40 MG tablet Take 1 tablet (40 mg total) by mouth daily. 03/16/17   McGowen, Adrian Blackwater, MD  QUEtiapine (SEROQUEL) 25 MG tablet Take 1 tablet (25 mg total) by mouth 2 (two) times daily. 07/12/17   McGowen, Adrian Blackwater, MD      Allergies    Patient has no known allergies.    Review of Systems   Review of  Systems  Gastrointestinal:  Positive for abdominal pain.  Ten systems reviewed and are negative for acute change, except as noted in the HPI.    Physical Exam Updated Vital Signs BP (!) 171/91 (BP Location: Right Arm)    Pulse (!) 56    Temp 97.9 F (36.6 C)    Resp 16    SpO2 97%   Physical Exam Vitals and nursing note reviewed.  Constitutional:      General: He is not in acute distress.    Appearance: He is well-developed. He is not diaphoretic.     Comments: Nontoxic-appearing and in no acute distress  HENT:     Head: Normocephalic and atraumatic.  Eyes:     General: No scleral icterus.    Conjunctiva/sclera: Conjunctivae normal.  Cardiovascular:     Rate and Rhythm: Normal rate and regular rhythm.     Pulses: Normal pulses.  Pulmonary:     Effort: Pulmonary effort is normal. No respiratory distress.     Breath sounds: No stridor. No wheezing.     Comments: Respirations even and unlabored Abdominal:     General: There is no distension.     Palpations: Abdomen is soft. There is no mass.     Tenderness: There is no abdominal tenderness. There is no guarding.     Hernia: No hernia is present.     Comments: Soft, nontender,  nondistended abdomen.  No masses or peritoneal signs.  Musculoskeletal:        General: Normal range of motion.     Cervical back: Normal range of motion.  Skin:    General: Skin is warm and dry.     Coloration: Skin is not pale.     Findings: No erythema or rash.  Neurological:     Mental Status: He is alert and oriented to person, place, and time.     Coordination: Coordination normal.  Psychiatric:        Behavior: Behavior normal.    ED Results / Procedures / Treatments   Labs (all labs ordered are listed, but only abnormal results are displayed) Labs Reviewed  COMPREHENSIVE METABOLIC PANEL - Abnormal; Notable for the following components:      Result Value   Glucose, Bld 123 (*)    Calcium 10.6 (*)    Total Protein 9.0 (*)    Albumin  5.5 (*)    Anion gap 16 (*)    All other components within normal limits  CBC - Abnormal; Notable for the following components:   WBC 17.5 (*)    All other components within normal limits  LIPASE, BLOOD  MAGNESIUM  URINALYSIS, ROUTINE W REFLEX MICROSCOPIC    EKG None  Radiology No results found.  Procedures Procedures    Medications Ordered in ED Medications  ondansetron (ZOFRAN) injection 4 mg (4 mg Intravenous Given 07/05/21 1746)    ED Course/ Medical Decision Making/ A&P                           Medical Decision Making Amount and/or Complexity of Data Reviewed Labs: ordered.  Risk Prescription drug management.   This patient presents to the ED for concern of N/V/D, this involves an extensive number of treatment options, and is a complaint that carries with it a high risk of complications and morbidity.  The differential diagnosis includes viral illness vs food borne illness vs cannabinoid hyperemesis vs IBD vs pSBO   Co morbidities that complicate the patient evaluation  IBS Hx ETOH abuse   Additional history obtained:  Additional history obtained from wife   Lab Tests:  I Ordered, and personally interpreted labs.  The pertinent results include:  Nonspecific leukocytosis of 17.5. Preserved liver and kidney function. Normal lipase.   Cardiac Monitoring:  The patient was maintained on a cardiac monitor.  I personally viewed and interpreted the cardiac monitored which showed an underlying rhythm of: NSR   Medicines ordered and prescription drug management:  I ordered medication including Zofran for vomiting  Reevaluation of the patient after these medicines showed that the patient resolved I have reviewed the patients home medicines and have made adjustments as needed   Test Considered:  CT abdomen/pelvis - however, patient with no c/o abdominal pain and benign, nontender exam.   Reevaluation:  After the interventions noted above, I  reevaluated the patient and found that they have :improved   Social Determinants of Health:  Insured Good social support   Dispostion:  Patient with symptoms consistent with viral gastroenteritis.  Vitals are stable, no fever.  No signs of dehydration, tolerating PO Pedialyte since given Zofran in the ED.  No focal abdominal pain or TTP.  No concern for appendicitis, cholecystitis, pancreatitis, ruptured viscus, UTI, kidney stone, or any other emergent abdominal etiology.  Leukocytosis likely secondary to the stress of acute dehydration and persistent vomiting.  After consideration of the diagnostic results and the patients response to treatment, I feel that the patent would benefit from supportive therapy with return if symptoms worsen. Discharged with course of ODT Zofran. Return precautions discussed and provided. Patient discharged in stable condition with no unaddressed concerns.          Final Clinical Impression(s) / ED Diagnoses Final diagnoses:  Viral gastroenteritis    Rx / DC Orders ED Discharge Orders          Ordered    ondansetron (ZOFRAN-ODT) 4 MG disintegrating tablet  Every 8 hours PRN        07/05/21 1943              Antonietta Breach, Hershal Coria 07/05/21 2008

## 2021-07-05 NOTE — Discharge Instructions (Signed)
Avoid fried foods, fatty foods, greasy foods, and milk products until symptoms resolve. Drink plenty of clear liquids. We recommend the use of Zofran as prescribed for nausea/vomiting. Follow-up with your primary care doctor to ensure resolution of symptoms. 

## 2021-07-05 NOTE — ED Triage Notes (Signed)
Patient reports to the er for nausea emesis and abdominal pain. Started at 0400 this morning.  ?

## 2021-07-09 ENCOUNTER — Encounter (HOSPITAL_BASED_OUTPATIENT_CLINIC_OR_DEPARTMENT_OTHER): Payer: Self-pay | Admitting: Emergency Medicine

## 2021-07-09 ENCOUNTER — Other Ambulatory Visit: Payer: Self-pay

## 2021-07-09 ENCOUNTER — Emergency Department (HOSPITAL_BASED_OUTPATIENT_CLINIC_OR_DEPARTMENT_OTHER)
Admission: EM | Admit: 2021-07-09 | Discharge: 2021-07-10 | Disposition: A | Discharge status: Home / Self Care | Payer: BC Managed Care – PPO | Attending: Emergency Medicine | Admitting: Emergency Medicine

## 2021-07-09 DIAGNOSIS — M542 Cervicalgia: Secondary | ICD-10-CM | POA: Diagnosis present

## 2021-07-09 DIAGNOSIS — M5412 Radiculopathy, cervical region: Secondary | ICD-10-CM | POA: Diagnosis not present

## 2021-07-09 MED ORDER — OXYCODONE-ACETAMINOPHEN 5-325 MG PO TABS
1.0000 | ORAL_TABLET | ORAL | Status: DC | PRN
  Administered 2021-07-09: 1 via ORAL
  Filled 2021-07-09: qty 1

## 2021-07-09 NOTE — ED Triage Notes (Signed)
Pt has a herniated disc, being treated by PCP and chiropractor, plan is to start prednisone tomorrow, to get MRI in 10days after steroid per PCP.  ?Positive tingling in L arm, unable to lie flat, unable to sleep d/t this.  ?Using advil (4p) and tylenol (4P)at home.  ?Denies loss of bowel/bladder, fever, chills ?

## 2021-07-10 MED ORDER — CYCLOBENZAPRINE HCL 10 MG PO TABS: 5.0000 mg | ORAL_TABLET | Freq: Every day | ORAL | 0 refills | Status: AC

## 2021-07-10 NOTE — Discharge Instructions (Signed)
You may use over-the-counter Aleve, topical muscle creams such as SalonPas, [employer], Bengay, etc. Please stretch, apply ice or heat (whichever helps), and have massage therapy for additional assistance. ? ?

## 2021-07-10 NOTE — ED Provider Notes (Signed)
?[facility] EMERGENCY DEPT ?Provider Note ? ?CSN: [id number] ?Arrival date & time: 07/09/21 1921 ? ?Chief Complaint(s) ?Back Pain ? ?HPI ?Andre Raymond is a 31 y.o. male here with ongoing neck and upper back pain for 3 months with numbness and tingling in the left arm.  Currently seeing a chiropractor.  Told he had a herniated disc.  Has only had x-rays.  Followed by his PCP, who is planning to treat with steroids and get an MRI if no improvement.  He presents tonight for severe pain making it difficult for him to sleep.  No acute changes.  No falls or trauma.  No weakness.  No bladder/bowel incontinence.  Patient has not had any instrumentation in the neck.  No other physical complaints. ? ?The history is provided by the patient.  ? ?Past Medical History ?Past Medical History:  ?Diagnosis Date  ? Alcohol abuse   ? Alcoholic gastritis 2018  ? Family history of hemochromatosis   ? 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  ? GAD (generalized anxiety disorder)   ? IBS (irritable bowel syndrome) 08/26/2011  ? ?Patient Active Problem List  ? Diagnosis Date Noted  ? Neck muscle strain 06/06/2014  ? IBS (irritable bowel syndrome) 08/26/2011  ? Generalized anxiety disorder 06/24/2011  ? ?Home Medication(s) ?Prior to Admission medications   ?Medication Sig Start Date End Date Taking? Authorizing Provider  ?cyclobenzaprine (FLEXERIL) 10 MG tablet Take 0.5-1 tablets (5-10 mg total) by mouth at bedtime for 10 days. 07/10/21 07/20/21 Yes Mitchell Iwanicki, Amadeo Garnet, MD  ?citalopram (CELEXA) 40 MG tablet TAKE 1 TABLET BY MOUTH ONCE DAILY DOSE  INCREASE 04/11/17   McGowen, Maryjean Morn, MD  ?clonazePAM (KLONOPIN) 0.5 MG tablet TAKE 1 TABLET BY MOUTH TWICE DAILY AS NEEDED FOR  ANXIETY 06/08/17   McGowen, Maryjean Morn, MD  ?fexofenadine (ALLEGRA) 180 MG tablet Take 1 tablet (180 mg total) by mouth daily. 07/12/17 07/12/18  McGowen, Maryjean Morn, MD  ?fluticasone (FLONASE) 50 MCG/ACT nasal spray Place 2 sprays into both nostrils daily.  07/12/17   McGowen, Maryjean Morn, MD  ?ondansetron (ZOFRAN-ODT) 4 MG disintegrating tablet Take 1 tablet (4 mg total) by mouth every 8 (eight) hours as needed for nausea or vomiting. 07/05/21   Antony Madura, PA-C  ?pantoprazole (PROTONIX) 40 MG tablet Take 1 tablet (40 mg total) by mouth daily. 03/16/17   McGowen, Maryjean Morn, MD  ?QUEtiapine (SEROQUEL) 25 MG tablet Take 1 tablet (25 mg total) by mouth 2 (two) times daily. 07/12/17   McGowen, Maryjean Morn, MD  ?                                                                                                                                  ?Allergies ?Patient has no known allergies. ? ?Review of Systems ?Review of Systems ?As noted in HPI ? ?Physical Exam ?Vital Signs  ?I have reviewed the triage vital signs ?BP 121/79  Pulse 70   Temp 98.3 ?F (36.8 ?C)   Resp 16   Wt 104.3 kg   SpO2 98%   BMI 31.19 kg/m?  ? ?Physical Exam ?Vitals reviewed.  ?Constitutional:   ?   General: He is not in acute distress. ?   Appearance: He is well-developed. He is not diaphoretic.  ?HENT:  ?   Head: Normocephalic and atraumatic.  ?   Right Ear: External ear normal.  ?   Left Ear: External ear normal.  ?   Nose: Nose normal.  ?   Mouth/Throat:  ?   Mouth: Mucous membranes are moist.  ?Eyes:  ?   General: No scleral icterus. ?   Conjunctiva/sclera: Conjunctivae normal.  ?Neck:  ?   Trachea: Phonation normal.  ?Cardiovascular:  ?   Rate and Rhythm: Normal rate and regular rhythm.  ?Pulmonary:  ?   Effort: Pulmonary effort is normal. No respiratory distress.  ?   Breath sounds: No stridor.  ?Abdominal:  ?   General: There is no distension.  ?Musculoskeletal:     ?   General: Normal range of motion.  ?   Right hand: Normal strength. Normal sensation. Normal pulse.  ?   Left hand: Normal strength. Normal sensation. Normal pulse.  ?   Cervical back: Normal range of motion. No rigidity. No spinous process tenderness or muscular tenderness. Normal range of motion.  ?Neurological:  ?   Mental Status: He  is alert and oriented to person, place, and time.  ?Psychiatric:     ?   Behavior: Behavior normal.  ? ? ?ED Results and Treatments ?Labs ?(all labs ordered are listed, but only abnormal results are displayed) ?Labs Reviewed - No data to display                                                                                                                       ?EKG ? EKG Interpretation ? ?Date/Time:    ?Ventricular Rate:    ?PR Interval:    ?QRS Duration:   ?QT Interval:    ?QTC Calculation:   ?R Axis:     ?Text Interpretation:   ?  ? ?  ? ?Radiology ?No results found. ? ?Pertinent labs & imaging results that were available during my care of the patient were reviewed by me and considered in my medical decision making (see MDM for details). ? ?Medications Ordered in ED ?Medications  ?oxyCODONE-acetaminophen (PERCOCET/ROXICET) 5-325 MG per tablet 1 tablet (1 tablet Oral Given 07/09/21 2018)  ?                                                               ?                                                                    ?  Procedures ?Procedures ? ?(including critical care time) ? ?Medical Decision Making / ED Course ? ? ? Complexity of Problem: ? ?Co-morbidities/SDOH that complicate the patient evaluation/care: ?None ? ?Additional history obtained: ?None ? ?Patient's presenting problem/concern and DDX listed below: ?Neck and upper back pain ?Herniated disc, impinged nerve, muscle spasm ?Low concern for meningitis, carotid dissection, osteomyelitis/discitis. ? ? ?  Complexity of Data: ?  ?Cardiac Monitoring: ?No ? ?Laboratory Tests ordered listed below with my independent interpretation: ?None ?  ?Imaging Studies ordered listed below with my independent interpretation: ?Not necessary ?  ?  ?ED Course:   ? ?Hospitalization Considered:  ?No ? ?Assessment, Intervention, and Reassessment: ?Upper back pain and neck pain ?Most consistent with MSK, less likely radiculopathy ?Patient provided with Percocet in triage which  resulted in significant pain relief. ?No focal deficits. ?No bilateral radiculopathy concerning for severe stenosis requiring emergent MRI at this time. ?Plan to treat supportively follow-up with PCP as scheduled.  ? ? ?Final Clinical Impression(s) / ED Diagnoses ?Final diagnoses:  ?Cervical radiculopathy  ? ?The patient appears reasonably screened and/or stabilized for discharge and I doubt any other medical condition or other [facility] requiring further screening, evaluation, or treatment in the ED at this time prior to discharge. Safe for discharge with strict return precautions. ? ?Disposition: Discharge ? ?Condition: Good ? ?I have discussed the results, Dx and Tx plan with the patient/family who expressed understanding and agree(s) with the plan. Discharge instructions discussed at length. The patient/family was given strict return precautions who verbalized understanding of the instructions. No further questions at time of discharge.  ? ? ?ED Discharge Orders   ? ?      Ordered  ?  cyclobenzaprine (FLEXERIL) 10 MG tablet  Daily at bedtime       ? 07/10/21 0018  ? ?  ?  ? ?  ? ? ? ?Follow Up: ?Jeoffrey MassedMcGowen, Philip H, MD ?[address] ?[place] [address] ?[phone] ? ?Call  ?to schedule an appointment for close follow up ? ? ? ?  ? ? ? ? ? ?This chart was dictated using voice recognition software.  Despite best efforts to proofread,  errors can occur which can change the documentation meaning. ? ?  ?Nira Connardama, Rashay Barnette Eduardo, MD ?07/10/21 0038 ? ?

## 2021-07-15 ENCOUNTER — Encounter (HOSPITAL_BASED_OUTPATIENT_CLINIC_OR_DEPARTMENT_OTHER): Payer: Self-pay

## 2021-07-15 ENCOUNTER — Emergency Department (HOSPITAL_BASED_OUTPATIENT_CLINIC_OR_DEPARTMENT_OTHER)
Admission: EM | Admit: 2021-07-15 | Discharge: 2021-07-15 | Disposition: A | Discharge status: Home / Self Care | Payer: BC Managed Care – PPO | Attending: Emergency Medicine | Admitting: Emergency Medicine

## 2021-07-15 ENCOUNTER — Other Ambulatory Visit: Payer: Self-pay

## 2021-07-15 ENCOUNTER — Emergency Department (HOSPITAL_BASED_OUTPATIENT_CLINIC_OR_DEPARTMENT_OTHER): Payer: BC Managed Care – PPO

## 2021-07-15 ENCOUNTER — Emergency Department (HOSPITAL_BASED_OUTPATIENT_CLINIC_OR_DEPARTMENT_OTHER): Payer: BC Managed Care – PPO | Admitting: Radiology

## 2021-07-15 DIAGNOSIS — R079 Chest pain, unspecified: Secondary | ICD-10-CM | POA: Insufficient documentation

## 2021-07-15 DIAGNOSIS — E876 Hypokalemia: Secondary | ICD-10-CM | POA: Diagnosis not present

## 2021-07-15 DIAGNOSIS — R7309 Other abnormal glucose: Secondary | ICD-10-CM | POA: Diagnosis not present

## 2021-07-15 DIAGNOSIS — D72829 Elevated white blood cell count, unspecified: Secondary | ICD-10-CM | POA: Insufficient documentation

## 2021-07-15 DIAGNOSIS — M5412 Radiculopathy, cervical region: Secondary | ICD-10-CM | POA: Insufficient documentation

## 2021-07-15 LAB — CBC
HCT: 44.2 % (ref 39.0–52.0)
Hemoglobin: 15.1 g/dL (ref 13.0–17.0)
MCH: 32.3 pg (ref 26.0–34.0)
MCHC: 34.2 g/dL (ref 30.0–36.0)
MCV: 94.4 fL (ref 80.0–100.0)
Platelets: 225 10*3/uL (ref 150–400)
RBC: 4.68 MIL/uL (ref 4.22–5.81)
RDW: 12.5 % (ref 11.5–15.5)
WBC: 18.9 10*3/uL — ABNORMAL HIGH (ref 4.0–10.5)
nRBC: 0.1 % (ref 0.0–0.2)

## 2021-07-15 LAB — BASIC METABOLIC PANEL
Anion gap: 11 (ref 5–15)
BUN: 27 mg/dL — ABNORMAL HIGH (ref 6–20)
CO2: 25 mmol/L (ref 22–32)
Calcium: 9.7 mg/dL (ref 8.9–10.3)
Chloride: 103 mmol/L (ref 98–111)
Creatinine, Ser: 1.22 mg/dL (ref 0.61–1.24)
GFR, Estimated: >60 mL/min (ref >60)
Glucose, Bld: 108 mg/dL — ABNORMAL HIGH (ref 70–99)
Potassium: 3.2 mmol/L — ABNORMAL LOW (ref 3.5–5.1)
Sodium: 139 mmol/L (ref 135–145)

## 2021-07-15 LAB — TROPONIN I (HIGH SENSITIVITY)
Troponin I (High Sensitivity): <2 ng/L (ref <18)
Troponin I (High Sensitivity): <2 ng/L (ref <18)

## 2021-07-15 MED ORDER — FENTANYL CITRATE PF 50 MCG/ML IJ SOSY
50.0000 ug | PREFILLED_SYRINGE | Freq: Once | INTRAMUSCULAR | Status: AC
  Administered 2021-07-15: 50 ug via INTRAVENOUS
  Filled 2021-07-15: qty 1

## 2021-07-15 MED ORDER — KETOROLAC TROMETHAMINE 60 MG/2ML IM SOLN
60.0000 mg | Freq: Once | INTRAMUSCULAR | Status: AC
  Administered 2021-07-15: 60 mg via INTRAMUSCULAR
  Filled 2021-07-15: qty 2

## 2021-07-15 MED ORDER — METHOCARBAMOL 1000 MG/10ML IJ SOLN
1000.0000 mg | Freq: Once | INTRAMUSCULAR | Status: AC
  Administered 2021-07-15: 1000 mg via INTRAMUSCULAR
  Filled 2021-07-15: qty 10

## 2021-07-15 MED ORDER — OXYCODONE-ACETAMINOPHEN 5-325 MG PO TABS: 1.0000 | ORAL_TABLET | Freq: Four times a day (QID) | ORAL | 0 refills | Status: DC | PRN

## 2021-07-15 MED ORDER — OXYCODONE-ACETAMINOPHEN 5-325 MG PO TABS
1.0000 | ORAL_TABLET | Freq: Once | ORAL | Status: AC
  Administered 2021-07-15: 1 via ORAL
  Filled 2021-07-15: qty 1

## 2021-07-15 MED ORDER — IOHEXOL 350 MG/ML SOLN
100.0000 mL | Freq: Once | INTRAVENOUS | Status: AC | PRN
  Administered 2021-07-15: 100 mL via INTRAVENOUS

## 2021-07-15 NOTE — Discharge Instructions (Addendum)
Please pick up your pain medication and take as prescribed.  Continue taking the muscle relaxer and Toradol that you have at home.  ? ?You will need to follow-up with your PCP for further evaluation of your ongoing pain.  We are unable to see the MRI results here today however hopefully they can go over the results tomorrow with you.  ? ?Return to the ED for any new/worsening symptoms.  ?

## 2021-07-15 NOTE — ED Triage Notes (Signed)
Patient here POV from CP.  ? ?Patient endorses CP since yesterday. Occurred in Mid Afternoon while on Vacation that worsened. Pain is Mid-Chest and Non-Radiating. Sharp in [place]. ? ?Patient was seen in the ED in [place] at 0300 and was given several Medications with no alleviation.  ? ?Mild SOB. No N/V/D. No Fevers. ? ?NAD Noted during Triage. A&Ox4. GCS 15. Ambulatory.  ?

## 2021-07-15 NOTE — ED Provider Notes (Signed)
?[facility]PT ?Provider Note ? ? ?CSN: [id number] ?Arrival date & time: 07/15/21  1608 ? ?  ? ?History ? ?Chief Complaint  ?Patient presents with  ? Chest Pain  ? ? ?Andre Raymond is a 31 y.o. male who presents to the ED Today with complaint of severe left sided chest pain that began yesterday. Pt reports hx of cervical radiculopathy/herniated disc that he has been dealing with since December of this past year. He has been following with a chiropractor for same as well as PCP. He was seen in the ED on 03/16 for same and provided with percocet. Pt reports he was on vacation this week in [place] and presented to their ED yesterday when he began having radiating pain into his chest; he received steroid injections with mild relief. He was scheduled to have an MRI done today of his C spine which he completed  however due to continued worsening pain decided to come to the ED. Pt has follow up appointment tomorrow with PCP. Denies any nausea, vomiting, SOB, diaphoresis. Denies hx CAD.  ? ?The history is provided by the patient and medical records.  ? ?  ? ?Home Medications ?Prior to Admission medications   ?Medication Sig Start Date End Date Taking? Authorizing Provider  ?oxyCODONE-acetaminophen (PERCOCET/ROXICET) 5-325 MG tablet Take 1 tablet by mouth every 6 (six) hours as needed for severe pain. 07/15/21  Yes Deb Loudin, PA-C  ?citalopram (CELEXA) 40 MG tablet TAKE 1 TABLET BY MOUTH ONCE DAILY DOSE  INCREASE 04/11/17   McGowen, Maryjean Morn, MD  ?clonazePAM (KLONOPIN) 0.5 MG tablet TAKE 1 TABLET BY MOUTH TWICE DAILY AS NEEDED FOR  ANXIETY 06/08/17   McGowen, Maryjean Morn, MD  ?cyclobenzaprine (FLEXERIL) 10 MG tablet Take 0.5-1 tablets (5-10 mg total) by mouth at bedtime for 10 days. 07/10/21 07/20/21  Nira Conn, MD  ?fexofenadine (ALLEGRA) 180 MG tablet Take 1 tablet (180 mg total) by mouth daily. 07/12/17 07/12/18  McGowen, Maryjean Morn, MD  ?fluticasone (FLONASE) 50 MCG/ACT nasal spray Place  2 sprays into both nostrils daily. 07/12/17   McGowen, Maryjean Morn, MD  ?ondansetron (ZOFRAN-ODT) 4 MG disintegrating tablet Take 1 tablet (4 mg total) by mouth every 8 (eight) hours as needed for nausea or vomiting. 07/05/21   Antony Madura, PA-C  ?pantoprazole (PROTONIX) 40 MG tablet Take 1 tablet (40 mg total) by mouth daily. 03/16/17   McGowen, Maryjean Morn, MD  ?QUEtiapine (SEROQUEL) 25 MG tablet Take 1 tablet (25 mg total) by mouth 2 (two) times daily. 07/12/17   McGowen, Maryjean Morn, MD  ?   ? ?Allergies    ?Patient has no known allergies.   ? ?Review of Systems   ?Review of Systems  ?Constitutional:  Negative for chills and fever.  ?Respiratory:  Negative for shortness of breath.   ?Cardiovascular:  Positive for chest pain.  ?Gastrointestinal:  Negative for nausea and vomiting.  ?Musculoskeletal:  Positive for neck pain.  ?Neurological:  Negative for weakness and numbness.  ?All other systems reviewed and are negative. ? ?Physical Exam ?Updated Vital Signs ?BP (!) 152/111   Pulse (!) 59   Temp 98.8 ?F (37.1 ?C) (Oral)   Resp 15   Ht 6' (1.829 m)   Wt 104.3 kg   SpO2 100%   BMI 31.19 kg/m?  ?Physical Exam ?Vitals and nursing note reviewed.  ?Constitutional:   ?   Appearance: He is not ill-appearing or diaphoretic.  ?   Comments: Uncomfortable appearing  ?HENT:  ?  Head: Normocephalic and atraumatic.  ?Eyes:  ?   Conjunctiva/sclera: Conjunctivae normal.  ?Cardiovascular:  ?   Rate and Rhythm: Normal rate and regular rhythm.  ?   Pulses:     ?     Radial pulses are 2+ on the right side and 2+ on the left side.  ?Pulmonary:  ?   Effort: Pulmonary effort is normal.  ?   Breath sounds: Normal breath sounds. No decreased breath sounds, wheezing, rhonchi or rales.  ?Chest:  ?   Chest wall: No tenderness.  ?Abdominal:  ?   Palpations: Abdomen is soft.  ?   Tenderness: There is no abdominal tenderness.  ?Musculoskeletal:  ?   Cervical back: Neck supple.  ?Skin: ?   General: Skin is warm and dry.  ?Neurological:  ?   Mental  Status: He is alert.  ?   Comments: Strength and sensation intact to BUE and BLEs  ? ? ?ED Results / Procedures / Treatments   ?Labs ?(all labs ordered are listed, but only abnormal results are displayed) ?Labs Reviewed  ?BASIC METABOLIC PANEL - Abnormal; Notable for the following components:  ?    Result Value  ? Potassium 3.2 (*)   ? Glucose, Bld 108 (*)   ? BUN 27 (*)   ? All other components within normal limits  ?CBC - Abnormal; Notable for the following components:  ? WBC 18.9 (*)   ? All other components within normal limits  ?TROPONIN I (HIGH SENSITIVITY)  ?TROPONIN I (HIGH SENSITIVITY)  ? ? ?EKG ?EKG Interpretation ? ?Date/Time:  Wednesday July 15 2021 16:18:55 EDT ?Ventricular Rate:  66 ?PR Interval:  134 ?QRS Duration: 106 ?QT Interval:  396 ?QTC Calculation: 415 ?R Axis:   9 ?Text Interpretation: Normal sinus rhythm with sinus arrhythmia Incomplete right bundle branch block Nonspecific T wave abnormality Abnormal ECG No previous ECGs available Confirmed by Jacalyn Lefevre [phone]) on 07/15/2021 6:49:58 PM ? ?Radiology ?DG Chest 2 View ? ?Result Date: 07/15/2021 ?CLINICAL DATA:  Chest pain EXAM: CHEST - 2 VIEW COMPARISON:  None. FINDINGS: The heart size and mediastinal contours are within normal limits. Both lungs are clear. The visualized skeletal structures are unremarkable. IMPRESSION: No active cardiopulmonary disease. Electronically Signed   By: Judie Petit.  Shick M.D.   On: 07/15/2021 16:35  ? ?CT Angio Chest/Abd/Pel for Dissection W and/or W/WO ? ?Result Date: 07/15/2021 ?CLINICAL DATA:  Chest pain EXAM: CT ANGIOGRAPHY CHEST, ABDOMEN AND PELVIS TECHNIQUE: Non-contrast CT of the chest was initially obtained. Multidetector CT imaging through the chest, abdomen and pelvis was performed using the standard protocol during bolus administration of intravenous contrast. Multiplanar reconstructed images and MIPs were obtained and reviewed to evaluate the vascular anatomy. RADIATION DOSE REDUCTION: This exam was  performed according to the departmental dose-optimization program which includes automated exposure control, adjustment of the mA and/or kV according to patient size and/or use of iterative reconstruction technique. CONTRAST:  OMNIPAQUE IOHEXOL 350 MG/ML SOLN COMPARISON:  Chest radiograph dated 07/15/2021 FINDINGS: CTA CHEST FINDINGS Cardiovascular: On unenhanced CT, there is no evidence of intramural hematoma. Following contrast administration, there is no evidence of thoracoabdominal aortic aneurysm or dissection. Although not tailored for evaluation of the pulmonary arteries, there is no evidence of central pulmonary embolism. The heart is normal in size.  No pericardial effusion. Mediastinum/Nodes: No suspicious mediastinal lymphadenopathy. Visualized thyroid is unremarkable. Lungs/Pleura: Very mild centrilobular emphysematous changes. No suspicious pulmonary nodules. No pleural effusion or pneumothorax. Musculoskeletal: Visualized osseous structures are within normal  limits. Review of the MIP images confirms the above findings. CTA ABDOMEN AND PELVIS FINDINGS VASCULAR Aorta: No evidence abdominal aortic aneurysm or dissection.  Patent. Celiac: Patent. SMA: Patent. Renals: Patent bilaterally. IMA: Patent. Inflow: Patent bilaterally. Veins: Unremarkable. Review of the MIP images confirms the above findings. NON-VASCULAR Hepatobiliary: Liver is within normal limits. Gallbladder is unremarkable. No intrahepatic or extrahepatic ductal dilatation. Pancreas: Within normal limits. Spleen: Within normal limits. Adrenals/Urinary Tract: Adrenal glands are within normal limits. Kidneys are within normal limits.  No hydronephrosis. Bladder is underdistended but unremarkable. Stomach/Bowel: Stomach is within normal limits. No evidence of bowel obstruction. Normal appendix (series 6/image 253). No colonic wall thickening or inflammatory changes. Lymphatic: No suspicious abdominopelvic lymphadenopathy. Reproductive:  Prostate is unremarkable. Other: No abdominopelvic ascites. Tiny fat containing bilateral inguinal hernias (series 6/image 297). Musculoskeletal: Visualized osseous structures are within normal limits. Review of the MI

## 2022-08-10 DIAGNOSIS — J302 Other seasonal allergic rhinitis: Secondary | ICD-10-CM | POA: Insufficient documentation

## 2022-10-12 IMAGING — DX DG CHEST 2V
2 series · 2 of 2 positions shown · non-contrast
Comparison: None.

CLINICAL DATA: Chest pain

EXAM:
CHEST - 2 VIEW

[chest pa]
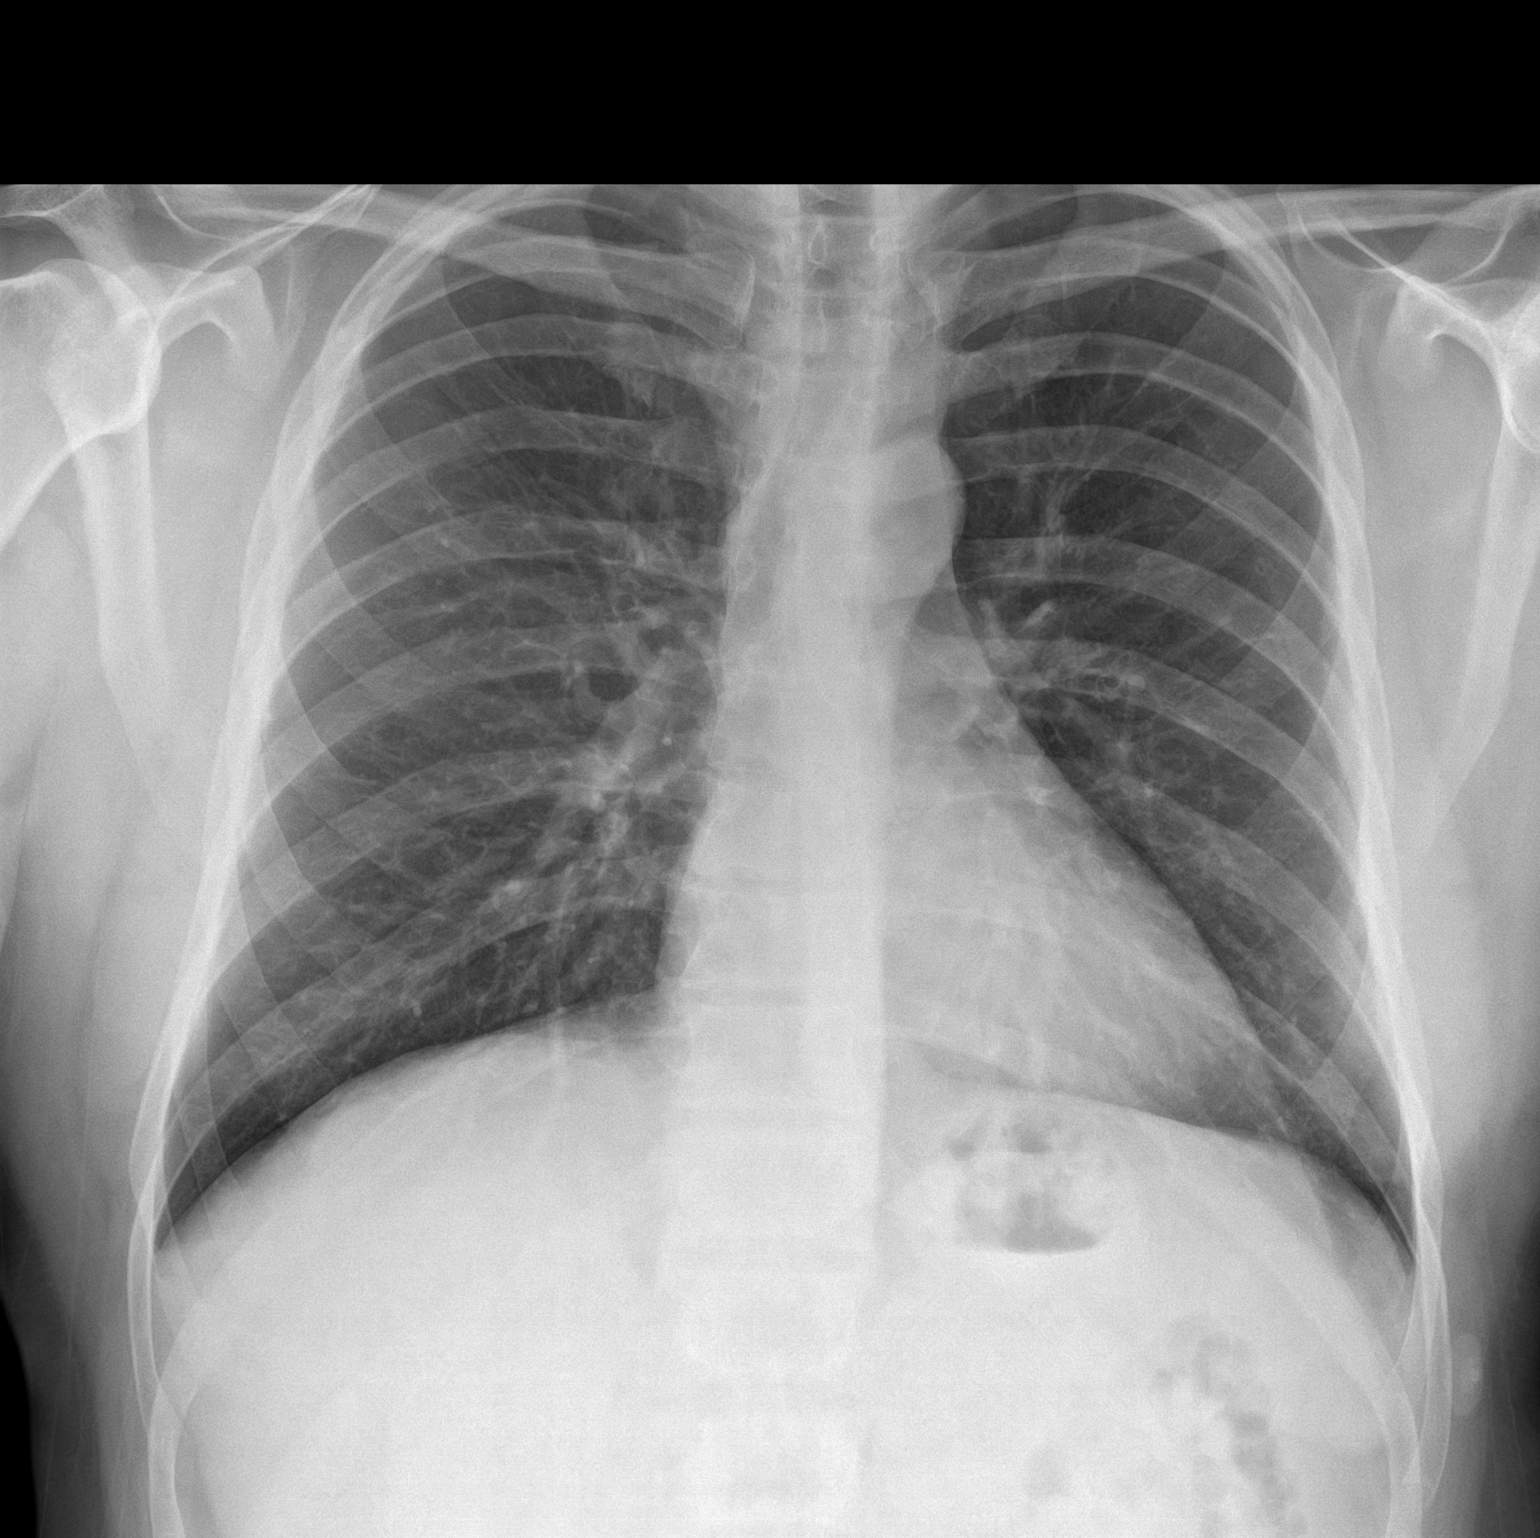

[chest lat]
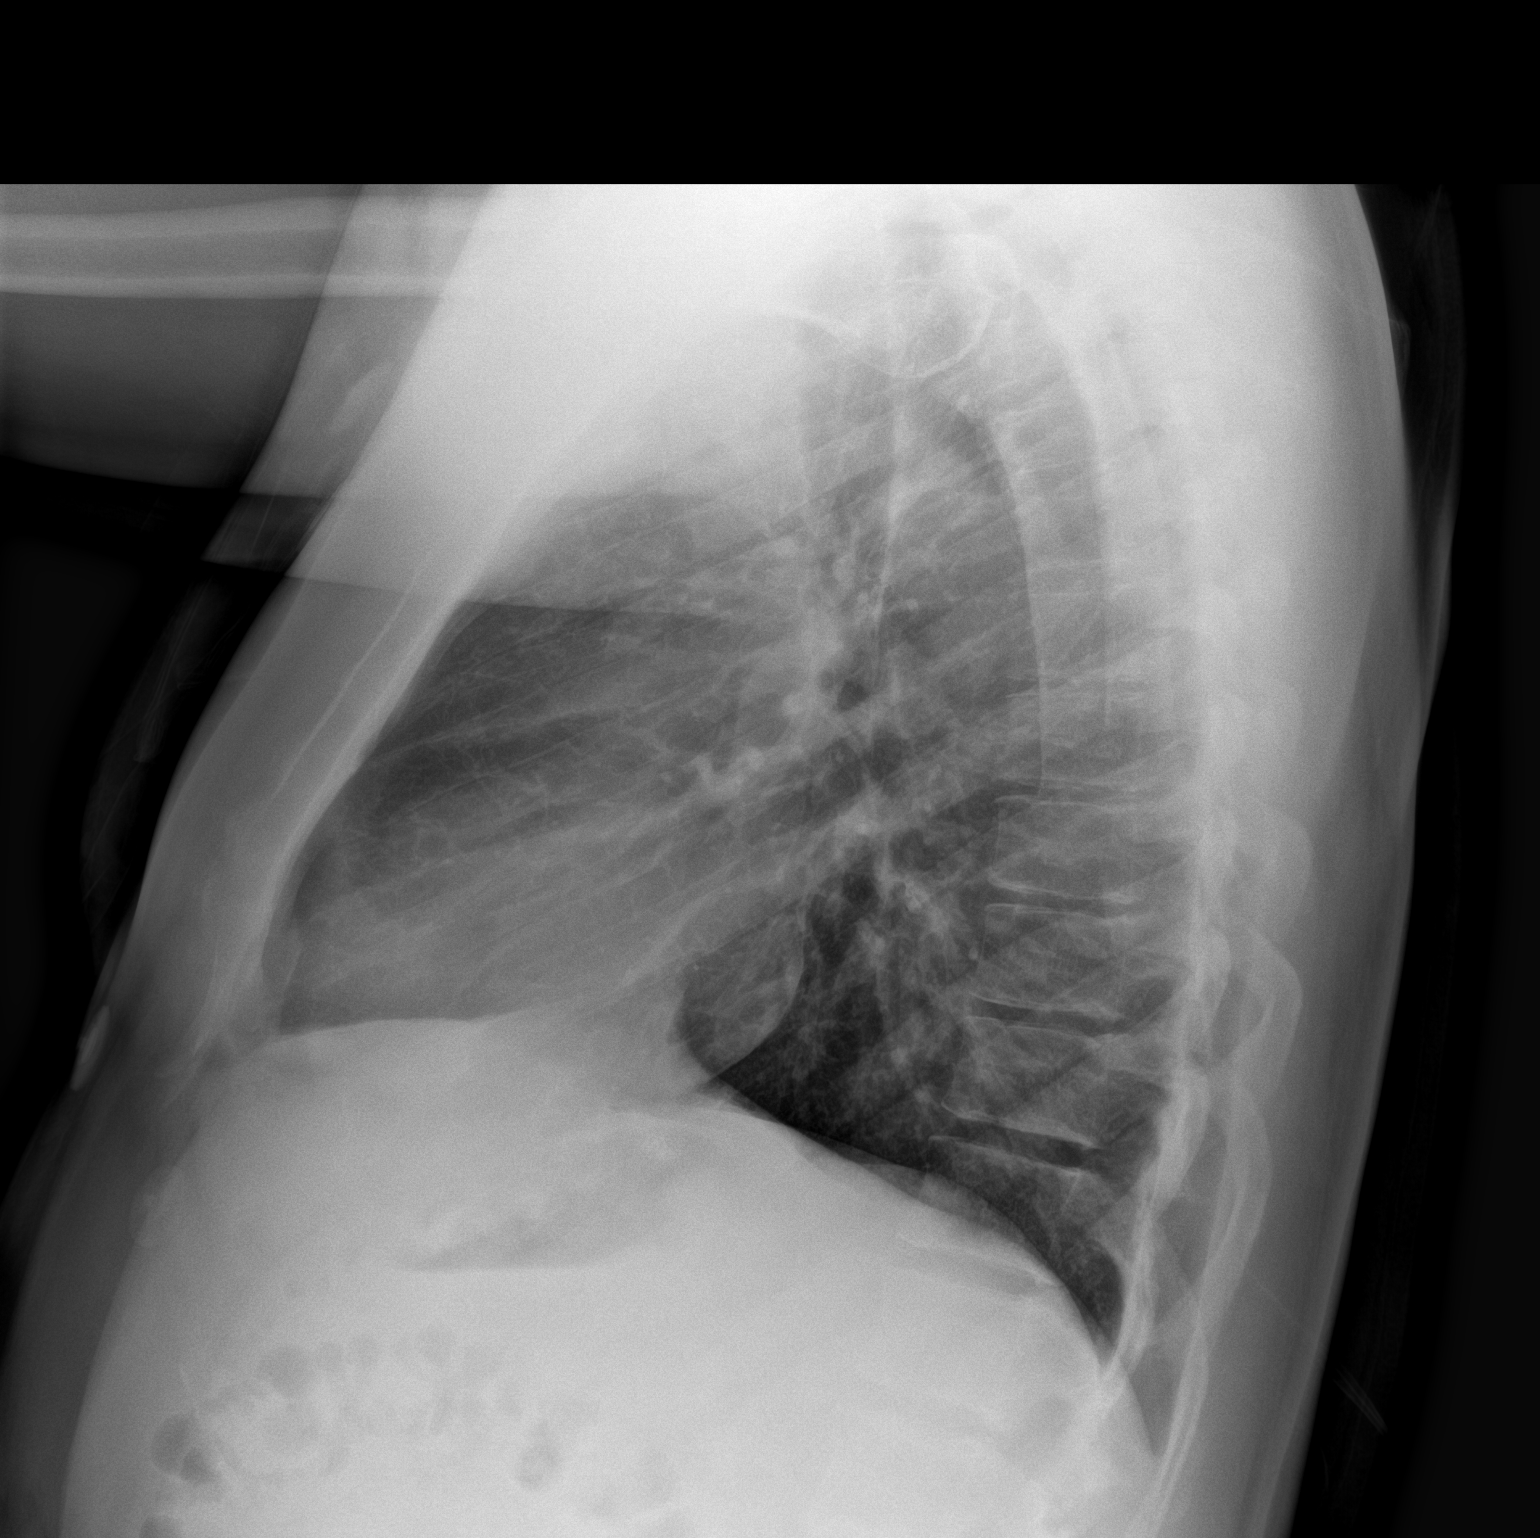

[2 of 2 positions shown; findings below may reference images not displayed]

FINDINGS: The heart size and mediastinal contours are within normal limits.
Both lungs are clear. The visualized skeletal structures are
unremarkable.
IMPRESSION: No active cardiopulmonary disease.

## 2023-06-08 LAB — LIPID PANEL
Cholesterol: 180 (ref 0–200)
HDL: 51 (ref 35–70)
LDL Cholesterol: 83
Triglycerides: 280 — AB (ref 40–160)

## 2023-06-08 LAB — CBC: RBC: 4.76 (ref 3.87–5.11)

## 2023-06-08 LAB — CBC AND DIFFERENTIAL
HCT: 45 (ref 41–53)
Hemoglobin: 15.1 (ref 13.5–17.5)
Neutrophils Absolute: 3.3
Platelets: 179 K/uL (ref 150–400)
WBC: 5.6

## 2023-06-08 LAB — PSA: PSA: 1.3

## 2023-06-08 LAB — TESTOSTERONE: Testosterone: 217

## 2023-10-10 LAB — TESTOSTERONE: Testosterone: 319.4

## 2024-02-15 ENCOUNTER — Encounter: Payer: Self-pay | Admitting: Family Medicine

## 2024-02-16 ENCOUNTER — Ambulatory Visit: Admitting: Family Medicine

## 2024-02-16 ENCOUNTER — Encounter: Payer: Self-pay | Admitting: Family Medicine

## 2024-02-16 VITALS — BP 95/54 | HR 64 | Temp 98.5°F | Ht 72.5 in | Wt 219.6 lb

## 2024-02-16 DIAGNOSIS — N3281 Overactive bladder: Secondary | ICD-10-CM | POA: Diagnosis not present

## 2024-02-16 DIAGNOSIS — E7800 Pure hypercholesterolemia, unspecified: Secondary | ICD-10-CM | POA: Insufficient documentation

## 2024-02-16 DIAGNOSIS — E782 Mixed hyperlipidemia: Secondary | ICD-10-CM

## 2024-02-16 DIAGNOSIS — Z5181 Encounter for therapeutic drug level monitoring: Secondary | ICD-10-CM

## 2024-02-16 DIAGNOSIS — K219 Gastro-esophageal reflux disease without esophagitis: Secondary | ICD-10-CM

## 2024-02-16 DIAGNOSIS — Z0000 Encounter for general adult medical examination without abnormal findings: Secondary | ICD-10-CM

## 2024-02-16 DIAGNOSIS — E559 Vitamin D deficiency, unspecified: Secondary | ICD-10-CM

## 2024-02-16 DIAGNOSIS — F411 Generalized anxiety disorder: Secondary | ICD-10-CM | POA: Diagnosis not present

## 2024-02-16 DIAGNOSIS — Z79899 Other long term (current) drug therapy: Secondary | ICD-10-CM

## 2024-02-16 MED ORDER — PANTOPRAZOLE SODIUM 40 MG PO TBEC: 40.0000 mg | DELAYED_RELEASE_TABLET | Freq: Every day | ORAL | 3 refills | Status: DC

## 2024-02-16 MED ORDER — OXYBUTYNIN CHLORIDE ER 5 MG PO TB24: 5.0000 mg | ORAL_TABLET | Freq: Every day | ORAL | 0 refills | Status: DC

## 2024-02-16 NOTE — Patient Instructions (Signed)
 Health Maintenance, Male  Adopting a healthy lifestyle and getting preventive care are important in promoting health and wellness. Ask your health care provider about:  The right schedule for you to have regular tests and exams.  Things you can do on your own to prevent diseases and keep yourself healthy.  What should I know about diet, weight, and exercise?  Eat a healthy diet    Eat a diet that includes plenty of vegetables, fruits, low-fat dairy products, and lean protein.  Do not eat a lot of foods that are high in solid fats, added sugars, or sodium.  Maintain a healthy weight  Body mass index (BMI) is a measurement that can be used to identify possible weight problems. It estimates body fat based on height and weight. Your health care provider can help determine your BMI and help you achieve or maintain a healthy weight.  Get regular exercise  Get regular exercise. This is one of the most important things you can do for your health. Most adults should:  Exercise for at least 150 minutes each week. The exercise should increase your heart rate and make you sweat (moderate-intensity exercise).  Do strengthening exercises at least twice a week. This is in addition to the moderate-intensity exercise.  Spend less time sitting. Even light physical activity can be beneficial.  Watch cholesterol and blood lipids  Have your blood tested for lipids and cholesterol at 34 years of age, then have this test every 5 years.  You may need to have your cholesterol levels checked more often if:  Your lipid or cholesterol levels are high.  You are older than 35 years of age.  You are at high risk for heart disease.  What should I know about cancer screening?  Many types of cancers can be detected early and may often be prevented. Depending on your health history and family history, you may need to have cancer screening at various ages. This may include screening for:  Colorectal cancer.  Prostate cancer.  Skin cancer.  Lung  cancer.  What should I know about heart disease, diabetes, and high blood pressure?  Blood pressure and heart disease  High blood pressure causes heart disease and increases the risk of stroke. This is more likely to develop in people who have high blood pressure readings or are overweight.  Talk with your health care provider about your target blood pressure readings.  Have your blood pressure checked:  Every 3-5 years if you are 24-52 years of age.  Every year if you are 3 years old or older.  If you are between the ages of 60 and 72 and are a current or former smoker, ask your health care provider if you should have a one-time screening for abdominal aortic aneurysm (AAA).  Diabetes  Have regular diabetes screenings. This checks your fasting blood sugar level. Have the screening done:  Once every three years after age 66 if you are at a normal weight and have a low risk for diabetes.  More often and at a younger age if you are overweight or have a high risk for diabetes.  What should I know about preventing infection?  Hepatitis B  If you have a higher risk for hepatitis B, you should be screened for this virus. Talk with your health care provider to find out if you are at risk for hepatitis B infection.  Hepatitis C  Blood testing is recommended for:  Everyone born from [birth year] through 1966.  Anyone  with known risk factors for hepatitis C.  Sexually transmitted infections (STIs)  You should be screened each year for STIs, including gonorrhea and chlamydia, if:  You are sexually active and are younger than 34 years of age.  You are older than 34 years of age and your health care provider tells you that you are at risk for this type of infection.  Your sexual activity has changed since you were last screened, and you are at increased risk for chlamydia or gonorrhea. Ask your health care provider if you are at risk.  Ask your health care provider about whether you are at high risk for HIV. Your health care provider  may recommend a prescription medicine to help prevent HIV infection. If you choose to take medicine to prevent HIV, you should first get tested for HIV. You should then be tested every 3 months for as long as you are taking the medicine.  Follow these instructions at home:  Alcohol use  Do not drink alcohol if your health care provider tells you not to drink.  If you drink alcohol:  Limit how much you have to 0-2 drinks a day.  Know how much alcohol is in your drink. In the [place] one drink equals one 12 oz bottle of beer (355 mL), one 5 oz glass of wine (148 mL), or one 1 oz glass of hard liquor (44 mL).  Lifestyle  Do not use any products that contain nicotine or tobacco. These products include cigarettes, chewing tobacco, and vaping devices, such as e-cigarettes. If you need help quitting, ask your health care provider.  Do not use street drugs.  Do not share needles.  Ask your health care provider for help if you need support or information about quitting drugs.  General instructions  Schedule regular health, dental, and eye exams.  Stay current with your vaccines.  Tell your health care provider if:  You often feel depressed.  You have ever been abused or do not feel safe at home.  Summary  Adopting a healthy lifestyle and getting preventive care are important in promoting health and wellness.  Follow your health care provider's instructions about healthy diet, exercising, and getting tested or screened for diseases.  Follow your health care provider's instructions on monitoring your cholesterol and blood pressure.  This information is not intended to replace advice given to you by your health care provider. Make sure you discuss any questions you have with your health care provider.  Document Revised: 09/01/2020 Document Reviewed: 09/01/2020  Elsevier Patient Education  2024 [employer].

## 2024-02-16 NOTE — Progress Notes (Signed)
 Office Note 02/16/2024  CC:  Chief Complaint  Patient presents with   Establish Care    HPI:  Andre Raymond is a 33 y.o. male who is here to reestablish care. I last saw him July 12, 2017 Patient's most recent primary MD: [facility] Old records in epic/health Link were reviewed prior to or during today's visit.   Has had several years history of significant nocturia and daytime urgency and frequency. No burning with urination.  Sometimes feels like the suprapubic area hurts when he needs to urinate that, once the flow starts it comes out strong.  Question of some incomplete emptying but not all the time.  No blood in urine. Has seen urology and they have treated this as BPH.  He states no improvement.  He does drink alcohol most nights and it does make it significantly worse.  Has significant GERD and has to take PPI every day or he gets significant return of symptoms. He got an EGD a few years ago. He would like to get off of PPI if there is any alternative.  History of hypercholesterolemia.  He had been on a atorvastatin up until about 6 months ago.  No problems taking it.  He would like to restart it. He was also on 50,000 units of vitamin D once a week up until 6 months ago.  PMP AWARE reviewed today: most recent rx for lorazepam  was filled 01/11/2024, # 60, rx by Dr. Dominique Bilis. Most recent testosterone  prescription was filled 04/25/2023, 2 mL, prescription by [facility]ne [facility]. No red flags.  Past Medical History:  Diagnosis Date   Alcohol abuse    Alcoholic gastritis 2018   Anxiety and depression    Cervical spondylosis    ED (erectile dysfunction)    Family history of hemochromatosis    Genetic testing of this pt was NEG 03/2017.   GERD (gastroesophageal reflux disease)    IBS (irritable bowel syndrome) 08/26/2011   Male hypogonadism     Past Surgical History:  Procedure Laterality Date   COLONOSCOPY W/ BIOPSIES  11/12/2020    FOOT SURGERY  08/25/2002   Right foot--Bone spurs ([facility])    Family History  Problem Relation Age of Onset   COPD Mother    Heart Problems Mother    Hypertension Father    Hyperlipidemia Father    Hemochromatosis Father    Alcohol abuse Father    Heart disease Maternal Grandfather     Social History   Socioeconomic History   Marital status: Single    Spouse name: Not on file   Number of children: Not on file   Years of education: Not on file   Highest education level: Not on file  Occupational History   Not on file  Tobacco Use   Smoking status: Light Smoker    Current packs/day: 0.25    Types: Cigarettes   Smokeless tobacco: Never  Vaping Use   Vaping status: Never Used  Substance and Sexual Activity   Alcohol use: Yes    Alcohol/week: 25.0 standard drinks of alcohol    Types: 25 Cans of beer per week    Comment: occ- beer   Drug use: Not Currently    Types: Marijuana    Comment: Pt denied; but tested + for [facility]   Sexual activity: Never  Other Topics Concern   Not on file  Social History Narrative   Single, 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   Possibly going to [employer] or [employer] to finish business degree.   No T/A/Ds.   Has one brother.   He exercises at least 3 times per week.   Social Drivers of Corporate investment banker Strain: Low Risk  (08/22/2023)   Received from [facility]   Overall Financial Resource Strain (CARDIA)    Difficulty of Paying Living Expenses: Not hard at all  Food Insecurity: No Food Insecurity (08/22/2023)   Received from [facility]   Hunger Vital Sign    Within the past 12 months, you worried that your food would run out before you got the money to buy more.: Never true    Within the past 12 months, the food you bought just didn't last and you didn't have money to get more.: Never true  Transportation Needs: No Transportation Needs (08/22/2023)   Received from [facility] - Transportation    Lack of Transportation (Medical): No    Lack of Transportation (Non-Medical): No  Physical Activity: Insufficiently Active (08/22/2023)   Received from [facility]   Exercise Vital Sign    On average, how many days per week do you engage in moderate to strenuous exercise (like a brisk walk)?: 3 days    On average, how many minutes do you engage in exercise at this level?: 30 min  Stress: Stress Concern Present (08/22/2023)   Received from [facility] - Occupational Stress Questionnaire    Feeling of Stress : Rather much  Social Connections: Socially Integrated (08/22/2023)   Received from [facility]   Social Network    How would you rate your social network (family, work, friends)?: Good participation with social networks  Intimate Partner Violence: Not At Risk (08/22/2023)   Received from [facility]   HITS    Over the last 12 months how often did your partner physically hurt you?: Never    Over the last 12 months how often did your partner insult you or talk down to you?: Never    Over the last 12 months how often did your partner threaten you with physical harm?: Never    Over the last 12 months how often did your partner scream or curse at you?: Never    Outpatient Encounter Medications as of 02/16/2024  Medication Sig   citalopram  (CELEXA ) 40 MG tablet TAKE 1 TABLET BY MOUTH ONCE DAILY DOSE  INCREASE   LORazepam  (ATIVAN ) 0.5 MG tablet Take 0.5 mg by mouth 2 (two) times daily as needed for anxiety.   montelukast (SINGULAIR) 10 MG tablet Take 10 mg by mouth.   pantoprazole  (PROTONIX ) 40 MG tablet Take 1 tablet (40 mg total) by mouth daily.   sildenafil (VIAGRA) 50 MG tablet Take 50 mg by mouth daily.   [DISCONTINUED] clonazePAM  (KLONOPIN ) 0.5 MG tablet TAKE 1 TABLET BY MOUTH TWICE DAILY AS NEEDED FOR  ANXIETY   [DISCONTINUED] fexofenadine  (ALLEGRA ) 180 MG tablet Take 1 tablet (180 mg total) by  mouth daily.   [DISCONTINUED] fluticasone  (FLONASE ) 50 MCG/ACT nasal spray Place 2 sprays into both nostrils daily.   [DISCONTINUED] ondansetron  (ZOFRAN -ODT) 4 MG disintegrating tablet Take 1 tablet (4 mg total) by mouth every 8 (eight) hours as needed for nausea or vomiting.   [DISCONTINUED] oxyCODONE -acetaminophen  (PERCOCET/ROXICET) 5-325 MG tablet Take 1 tablet by mouth every 6 (six) hours as needed for severe pain.   [DISCONTINUED] QUEtiapine  (SEROQUEL ) 25 MG tablet Take 1  tablet (25 mg total) by mouth 2 (two) times daily.   No facility-administered encounter medications on file as of 02/16/2024.    Allergies  Allergen Reactions   Fluoxetine Other (See Comments)    Suicidal ideation    Review of Systems  Constitutional:  Negative for appetite change, chills, fatigue and fever.  HENT:  Negative for congestion, dental problem, ear pain and sore throat.   Eyes:  Negative for discharge, redness and visual disturbance.  Respiratory:  Negative for cough, chest tightness, shortness of breath and wheezing.   Cardiovascular:  Negative for chest pain, palpitations and leg swelling.  Gastrointestinal:  Negative for abdominal pain, blood in stool, diarrhea, nausea and vomiting.  Genitourinary:  Positive for frequency and urgency. Negative for difficulty urinating, dysuria, flank pain and hematuria.  Musculoskeletal:  Negative for arthralgias, back pain, joint swelling, myalgias and neck stiffness.  Skin:  Negative for pallor and rash.  Neurological:  Negative for dizziness, speech difficulty, weakness and headaches.  Hematological:  Negative for adenopathy. Does not bruise/bleed easily.  Psychiatric/Behavioral:  Negative for confusion and sleep disturbance. The patient is not nervous/anxious.     PE; Blood pressure (!) 95/54, pulse 64, temperature 98.5 F (36.9 C), height 6' 0.5 (1.842 m), weight 219 lb 9.6 oz (99.6 kg), SpO2 97%. Body mass index is 29.37 kg/m.   Physical Exam  Gen:  Alert, well appearing.  Patient is oriented to person, place, time, and situation. AFFECT: pleasant, lucid thought and speech. ENT: Ears: EACs clear, normal epithelium.  TMs with good light reflex and landmarks bilaterally.  Eyes: no injection, icteris, swelling, or exudate.  EOMI, PERRLA. Nose: no drainage or turbinate edema/swelling.  No injection or focal lesion.  Mouth: lips without lesion/swelling.  Oral mucosa pink and moist.  Dentition intact and without obvious caries or gingival swelling.  Oropharynx without erythema, exudate, or swelling.  Neck: supple/nontender.  No LAD, mass, or TM.  Carotid pulses 2+ bilaterally, without bruits. CV: RRR, no m/r/g.   LUNGS: CTA bilat, nonlabored resps, good aeration in all lung fields. ABD: soft, NT, ND, BS normal.  No hepatospenomegaly or mass.  No bruits. EXT: no clubbing, cyanosis, or edema.  Musculoskeletal: no joint swelling, erythema, warmth, or tenderness.  ROM of all joints intact. Skin - no sores or suspicious lesions or rashes or color changes  Pertinent labs:  Last CBC Lab Results  Component Value Date   WBC 18.9 (H) 07/15/2021   HGB 15.1 07/15/2021   HCT 44.2 07/15/2021   MCV 94.4 07/15/2021   MCH 32.3 07/15/2021   RDW 12.5 07/15/2021   PLT 225 07/15/2021   Last metabolic panel Lab Results  Component Value Date   GLUCOSE 108 (H) 07/15/2021   NA 139 07/15/2021   K 3.2 (L) 07/15/2021   CL 103 07/15/2021   CO2 25 07/15/2021   BUN 27 (H) 07/15/2021   CREATININE 1.22 07/15/2021   GFRNONAA >60 07/15/2021   CALCIUM 9.7 07/15/2021   PHOS 2.9 08/19/2011   PROT 9.0 (H) 07/05/2021   ALBUMIN 5.5 (H) 07/05/2021   BILITOT 0.7 07/05/2021   ALKPHOS 87 07/05/2021   AST 22 07/05/2021   ALT 31 07/05/2021   ANIONGAP 11 07/15/2021   Last lipids Lab Results  Component Value Date   CHOL 240 (H) 09/03/2015   HDL 50.80 09/03/2015   LDLCALC 156 (H) 09/03/2015   TRIG 168.0 (H) 09/03/2015   CHOLHDL 5 09/03/2015   Last thyroid   functions Lab Results  Component Value Date  TSH 0.80 09/03/2015   ASSESSMENT AND PLAN:   New patient, establishing care.  1.  Chronic urinary urgency and frequency. No significant improvement with trials of antibiotics, alpha blockers, and tadalafil. He states that so far he has not been tried on any medications for overactive bladder. We will start a trial of oxybutynin 5 mg a day.  Therapeutic expectations and side effect profile of medication discussed today.  Patient's questions answered.  #2 GERD.  Requires PPI chronically. Discussed possible switch over to high-dose H2 blocker twice daily due to patient's concern for the potential long-term ramifications of daily PPI. Will likely switch over to Pepcid 40 mg twice a day but will make only 1 medication change today--> see #1 above.  #3 hypercholesterolemia. Return for fasting lipid panel. Anticipate restart atorvastatin 20 mg a day.  #4 vitamin D deficiency. Return for vitamin D level. Anticipate restarting supplement.  #5 chronic anxiety. Continue citalopram  20 mg a day and lorazepam  0.5 mg twice daily as needed. No new prescriptions were needed today.  #6 Health maintenance exam: Reviewed age and gender appropriate health maintenance issues (prudent diet, regular exercise, health risks of tobacco and excessive alcohol, use of seatbelts, fire alarms in home, use of sunscreen).  Also reviewed age and gender appropriate health screening as well as vaccine recommendations. Vaccines: no vaccines today. Labs: Return for fasting health panel, hemoglobin A1c, and vitamin D level.  An After Visit Summary was printed and given to the patient.  No follow-ups on file.  Signed:  Gerlene Hockey, MD           02/16/2024

## 2024-02-22 ENCOUNTER — Other Ambulatory Visit: Payer: Self-pay | Admitting: Family Medicine

## 2024-02-22 MED ORDER — LORAZEPAM 0.5 MG PO TABS: 0.5000 mg | ORAL_TABLET | Freq: Two times a day (BID) | ORAL | 1 refills | Status: AC | PRN

## 2024-02-22 NOTE — Telephone Encounter (Signed)
 Refill requested Lorazepam  sent to [facility] on [address]  Last OV 10/23 Next ov due 11/6

## 2024-02-22 NOTE — Telephone Encounter (Signed)
 Copied from CRM [id number]. Topic: Clinical - Medication Refill >> Feb 22, 2024 10:53 AM Mercedes MATSU wrote: Medication: LORazepam  (ATIVAN ) 0.5 MG tablet  Has the patient contacted their pharmacy? Yes (Agent: If no, request that the patient contact the pharmacy for the refill. If patient does not wish to contact the pharmacy document the reason why and proceed with request.) (Agent: If yes, when and what did the pharmacy advise?)  This is the patient's preferred pharmacy:  [id number] [place], [place] - [address] AT [facility] [address] [facility] [address] [place] [address] Phone: [phone] Fax: [phone]  Is this the correct pharmacy for this prescription? Yes If no, delete pharmacy and type the correct one.   Has the prescription been filled recently? Yes  Is the patient out of the medication? Yes  Has the patient been seen for an appointment in the last year OR does the patient have an upcoming appointment? Yes  Can we respond through MyChart? Yes  Agent: Please be advised that Rx refills may take up to 3 business days. We ask that you follow-up with your pharmacy.

## 2024-02-24 ENCOUNTER — Other Ambulatory Visit (INDEPENDENT_AMBULATORY_CARE_PROVIDER_SITE_OTHER)

## 2024-02-24 DIAGNOSIS — E559 Vitamin D deficiency, unspecified: Secondary | ICD-10-CM | POA: Diagnosis not present

## 2024-02-24 DIAGNOSIS — E782 Mixed hyperlipidemia: Secondary | ICD-10-CM

## 2024-02-24 LAB — COMPREHENSIVE METABOLIC PANEL WITH GFR
ALT: 19 U/L (ref 0–53)
AST: 16 U/L (ref 0–37)
Albumin: 4.8 g/dL (ref 3.5–5.2)
Alkaline Phosphatase: 77 U/L (ref 39–117)
BUN: 18 mg/dL (ref 6–23)
CO2: 31 meq/L (ref 19–32)
Calcium: 9.7 mg/dL (ref 8.4–10.5)
Chloride: 102 meq/L (ref 96–112)
Creatinine, Ser: 1.01 mg/dL (ref 0.40–1.50)
GFR: 97.18 mL/min (ref >60.00)
Glucose, Bld: 106 mg/dL — ABNORMAL HIGH (ref 70–99)
Potassium: 4.3 meq/L (ref 3.5–5.1)
Sodium: 139 meq/L (ref 135–145)
Total Bilirubin: 0.5 mg/dL (ref 0.2–1.2)
Total Protein: 7.5 g/dL (ref 6.0–8.3)

## 2024-02-24 LAB — CBC WITH DIFFERENTIAL/PLATELET
Basophils Absolute: 0.1 K/uL (ref 0.0–0.1)
Basophils Relative: 0.7 % (ref 0.0–3.0)
Eosinophils Absolute: 0.3 K/uL (ref 0.0–0.7)
Eosinophils Relative: 4.4 % (ref 0.0–5.0)
HCT: 43.3 % (ref 39.0–52.0)
Hemoglobin: 14.4 g/dL (ref 13.0–17.0)
Lymphocytes Relative: 30 % (ref 12.0–46.0)
Lymphs Abs: 2.1 K/uL (ref 0.7–4.0)
MCHC: 33.2 g/dL (ref 30.0–36.0)
MCV: 95.7 fl (ref 78.0–100.0)
Monocytes Absolute: 0.5 K/uL (ref 0.1–1.0)
Monocytes Relative: 6.3 % (ref 3.0–12.0)
Neutro Abs: 4.2 K/uL (ref 1.4–7.7)
Neutrophils Relative %: 58.6 % (ref 43.0–77.0)
Platelets: 186 K/uL (ref 150.0–400.0)
RBC: 4.52 Mil/uL (ref 4.22–5.81)
RDW: 12.5 % (ref 11.5–15.5)
WBC: 7.2 K/uL (ref 4.0–10.5)

## 2024-02-24 LAB — LIPID PANEL
Cholesterol: 266 mg/dL — ABNORMAL HIGH (ref 0–200)
HDL: 49.1 mg/dL (ref >39.00)
LDL Cholesterol: 164 mg/dL — ABNORMAL HIGH (ref 0–99)
NonHDL: 216.49
Total CHOL/HDL Ratio: 5
Triglycerides: 264 mg/dL — ABNORMAL HIGH (ref 0.0–149.0)
VLDL: 52.8 mg/dL — ABNORMAL HIGH (ref 0.0–40.0)

## 2024-02-24 LAB — HEMOGLOBIN A1C: Hgb A1c MFr Bld: 5.7 % (ref 4.6–6.5)

## 2024-02-24 LAB — VITAMIN D 25 HYDROXY (VIT D DEFICIENCY, FRACTURES): VITD: 22.51 ng/mL — ABNORMAL LOW (ref 30.00–100.00)

## 2024-02-24 LAB — TSH: TSH: 1.2 u[IU]/mL (ref 0.35–5.50)

## 2024-02-26 ENCOUNTER — Ambulatory Visit: Payer: Self-pay | Admitting: Family Medicine

## 2024-03-01 ENCOUNTER — Encounter: Payer: Self-pay | Admitting: Family Medicine

## 2024-03-01 ENCOUNTER — Ambulatory Visit (INDEPENDENT_AMBULATORY_CARE_PROVIDER_SITE_OTHER): Admitting: Family Medicine

## 2024-03-01 VITALS — BP 116/68 | HR 49 | Temp 98.5°F | Wt 215.0 lb

## 2024-03-01 DIAGNOSIS — E559 Vitamin D deficiency, unspecified: Secondary | ICD-10-CM | POA: Diagnosis not present

## 2024-03-01 DIAGNOSIS — K219 Gastro-esophageal reflux disease without esophagitis: Secondary | ICD-10-CM

## 2024-03-01 DIAGNOSIS — N3281 Overactive bladder: Secondary | ICD-10-CM

## 2024-03-01 MED ORDER — OXYBUTYNIN CHLORIDE ER 10 MG PO TB24: 10.0000 mg | ORAL_TABLET | Freq: Every day | ORAL | 0 refills | Status: DC

## 2024-03-01 NOTE — Progress Notes (Signed)
 OFFICE VISIT  03/01/2024  CC:  Chief Complaint  Patient presents with   Urinary Frequency    2 wk f/u.     Patient is a 33 y.o. male who presents accompanied by his wife for 2-week follow-up chronic urinary urgency and frequency as well as follow-up GERD on chronic PPI therapy. A/P as of last visit: 1.  Chronic urinary urgency and frequency. No significant improvement with trials of antibiotics, alpha blockers, and tadalafil. He states that so far he has not been tried on any medications for overactive bladder. We will start a trial of oxybutynin 5 mg a day.  Therapeutic expectations and side effect profile of medication discussed today.  Patient's questions answered.   #2 GERD.  Requires PPI chronically. Discussed possible switch over to high-dose H2 blocker twice daily due to patient's concern for the potential long-term ramifications of daily PPI. Will likely switch over to Pepcid 40 mg twice a day but will make only 1 medication change today--> see #1 above.   #3 hypercholesterolemia. Return for fasting lipid panel. Anticipate restart atorvastatin 20 mg a day.   #4 vitamin D deficiency. Return for vitamin D level. Anticipate restarting supplement.   #5 chronic anxiety. Continue citalopram  20 mg a day and lorazepam  0.5 mg twice daily as needed. No new prescriptions were needed today.  INTERIM HX: Labs all great last visit except vitamin D level low.  Encouraged him to start over-the-counter vitamin D 1000 unit tab daily, which he has done.  He has noted significant amount of improvement in his OAB symptoms. He gets up less frequently at night to urinate.  He does adjust his fluids on some nights and feels like this is helpful as well. No adverse effects from the oxybutynin.  No acute concerns.   Past Medical History:  Diagnosis Date   Alcohol abuse    Alcoholic gastritis 2018   Anxiety and depression    Cervical spondylosis    ED (erectile dysfunction)    Family  history of hemochromatosis    Genetic testing of this pt was NEG 03/2017.   GERD (gastroesophageal reflux disease)    IBS (irritable bowel syndrome) 08/26/2011   Male hypogonadism    anabolic steroid use-->stopped-->few years later lab showed low T-->test supp mild help-->stopped d/t side effects-->Dr. Beryl consult->plan is to recheck levels after being off testost for a while (2025)   Mixed hyperlipidemia    OSA (obstructive sleep apnea)    suspected (neurol to do sleep study as of 2025)   Urinary frequency    x years. Urology workup (cystoscopy.  Trial of antibiotics, trial of Flomax and trial of tadalafil no help)    Past Surgical History:  Procedure Laterality Date   COLONOSCOPY W/ BIOPSIES  11/12/2020   CYSTOSCOPY     mild BPH   ESOPHAGOGASTRODUODENOSCOPY     11/12/2020   FOOT SURGERY  08/25/2002   Right foot--Bone spurs [facility])    Outpatient Medications Prior to Visit  Medication Sig Dispense Refill   citalopram  (CELEXA ) 40 MG tablet TAKE 1 TABLET BY MOUTH ONCE DAILY DOSE  INCREASE 30 tablet 1   LORazepam  (ATIVAN ) 0.5 MG tablet Take 1 tablet (0.5 mg total) by mouth 2 (two) times daily as needed for anxiety. 60 tablet 1   montelukast (SINGULAIR) 10 MG tablet Take 10 mg by mouth.     pantoprazole  (PROTONIX ) 40 MG tablet Take 1 tablet (40 mg total) by mouth daily. 30 tablet 3   sildenafil (VIAGRA) 50 MG  tablet Take 50 mg by mouth daily.     oxybutynin (DITROPAN-XL) 5 MG 24 hr tablet Take 1 tablet (5 mg total) by mouth at bedtime. 30 tablet 0   No facility-administered medications prior to visit.    Allergies  Allergen Reactions   Fluoxetine Other (See Comments)    Suicidal ideation    Review of Systems As per HPI  PE:    03/01/2024    9:58 AM 02/16/2024    1:25 PM 07/15/2021    8:57 PM  Vitals with BMI  Height  6' 0.5   Weight 215 lbs 219 lbs 10 oz   BMI 28.74 29.36   Systolic 116 95 133  Diastolic 68 54 96  Pulse 49 64 64     Physical Exam  Gen:  Alert, well appearing.  Patient is oriented to person, place, time, and situation. AFFECT: pleasant, lucid thought and speech. No further exam today  LABS:  Last CBC Lab Results  Component Value Date   WBC 7.2 02/24/2024   HGB 14.4 02/24/2024   HCT 43.3 02/24/2024   MCV 95.7 02/24/2024   MCH 32.3 07/15/2021   RDW 12.5 02/24/2024   PLT 186.0 02/24/2024   Last metabolic panel Lab Results  Component Value Date   GLUCOSE 106 (H) 02/24/2024   NA 139 02/24/2024   K 4.3 02/24/2024   CL 102 02/24/2024   CO2 31 02/24/2024   BUN 18 02/24/2024   CREATININE 1.01 02/24/2024   GFR 97.18 02/24/2024   CALCIUM 9.7 02/24/2024   PHOS 2.9 08/19/2011   PROT 7.5 02/24/2024   ALBUMIN 4.8 02/24/2024   BILITOT 0.5 02/24/2024   ALKPHOS 77 02/24/2024   AST 16 02/24/2024   ALT 19 02/24/2024   ANIONGAP 11 07/15/2021   Last lipids Lab Results  Component Value Date   CHOL 266 (H) 02/24/2024   HDL 49.10 02/24/2024   LDLCALC 164 (H) 02/24/2024   TRIG 264.0 (H) 02/24/2024   CHOLHDL 5 02/24/2024   Last hemoglobin A1c Lab Results  Component Value Date   HGBA1C 5.7 02/24/2024   Last thyroid  functions Lab Results  Component Value Date   TSH 1.20 02/24/2024   Last vitamin D Lab Results  Component Value Date   VD25OH 22.51 (L) 02/24/2024   IMPRESSION AND PLAN:  #1 overactive bladder. Mild to moderate improvement with Ditropan XL 5 mg daily.  Increase to 10 mg per night now.  2 GERD.  Requires PPI chronically. Discussed possible switch over to high-dose H2 blocker twice daily due to patient's concern for the potential long-term ramifications of daily PPI. We reviewed this again today and he wants to defer any decision about this for at least a few months.   #3 vitamin D deficiency. His level was 22.5 about 1 week ago. He started 1000 unit vitamin D supplement daily.  An After Visit Summary was printed and given to the patient.  FOLLOW UP: Return in about 4 weeks (around 03/29/2024)  for Follow-up overactive bladder.  Signed:  Gerlene Hockey, MD           03/01/2024

## 2024-03-16 ENCOUNTER — Ambulatory Visit: Admitting: Family Medicine

## 2024-03-16 ENCOUNTER — Encounter: Payer: Self-pay | Admitting: Family Medicine

## 2024-03-16 VITALS — BP 110/74 | HR 62 | Temp 97.6°F | Wt 216.2 lb

## 2024-03-16 DIAGNOSIS — R350 Frequency of micturition: Secondary | ICD-10-CM

## 2024-03-16 DIAGNOSIS — R351 Nocturia: Secondary | ICD-10-CM | POA: Diagnosis not present

## 2024-03-16 DIAGNOSIS — N3281 Overactive bladder: Secondary | ICD-10-CM

## 2024-03-16 LAB — POCT URINALYSIS DIPSTICK
Bilirubin, UA: NEGATIVE
Blood, UA: NEGATIVE
Glucose, UA: NEGATIVE
Ketones, UA: NEGATIVE
Leukocytes, UA: NEGATIVE
Nitrite, UA: NEGATIVE
Protein, UA: NEGATIVE
Spec Grav, UA: 1.02 (ref 1.010–1.025)
Urobilinogen, UA: 0.2 U/dL
pH, UA: 6 (ref 5.0–8.0)

## 2024-03-16 NOTE — Progress Notes (Signed)
 OFFICE VISIT  03/16/2024  CC:  Chief Complaint  Patient presents with   Urinary Follow Up    Pt would like repeat UA to ensure UTI resolved; prior report of frequent urination    Patient is a 33 y.o. male who presents for urinary frequency.  HPI: Last night Andre Raymond had to get up about 6 times to urinate.  This is more than his usual so he was concerned about a possible urinary infection. He has no burning with urination. At last visit on 03/01/2024 we increased his Ditropan  XL to 10 mg daily and he says this has helped a moderate amount for his daytime urgency and frequency.  He does not drink any beer yesterday. Overall he has cut back on beer intake significantly.  Drinks only very small amount of caffeine.  No blood in urine.  Past Medical History:  Diagnosis Date   Alcohol abuse    Alcoholic gastritis 2018   Anxiety and depression    Cervical spondylosis    ED (erectile dysfunction)    Family history of hemochromatosis    Genetic testing of this pt was NEG 03/2017.   GERD (gastroesophageal reflux disease)    IBS (irritable bowel syndrome) 08/26/2011   Male hypogonadism    anabolic steroid use-->stopped-->few years later lab showed low T-->test supp mild help-->stopped d/t side effects-->Dr. Beryl consult->plan is to recheck levels after being off testost for a while (2025)   Mixed hyperlipidemia    OSA (obstructive sleep apnea)    suspected (neurol to do sleep study as of 2025)   Urinary frequency    x years. Urology workup (cystoscopy.  Trial of antibiotics, trial of Flomax and trial of tadalafil no help)    Past Surgical History:  Procedure Laterality Date   COLONOSCOPY W/ BIOPSIES  11/12/2020   CYSTOSCOPY     mild BPH   ESOPHAGOGASTRODUODENOSCOPY     11/12/2020   FOOT SURGERY  08/25/2002   Right foot--Bone spurs [facility])    Outpatient Medications Prior to Visit  Medication Sig Dispense Refill   citalopram  (CELEXA ) 40 MG tablet TAKE 1 TABLET BY MOUTH ONCE  DAILY DOSE  INCREASE 30 tablet 1   LORazepam  (ATIVAN ) 0.5 MG tablet Take 1 tablet (0.5 mg total) by mouth 2 (two) times daily as needed for anxiety. 60 tablet 1   montelukast (SINGULAIR) 10 MG tablet Take 10 mg by mouth.     oxybutynin  (DITROPAN  XL) 10 MG 24 hr tablet Take 1 tablet (10 mg total) by mouth at bedtime. 30 tablet 0   pantoprazole  (PROTONIX ) 40 MG tablet Take 1 tablet (40 mg total) by mouth daily. 30 tablet 3   sildenafil (VIAGRA) 50 MG tablet Take 50 mg by mouth daily.     No facility-administered medications prior to visit.    Allergies  Allergen Reactions   Fluoxetine Other (See Comments)    Suicidal ideation    Review of Systems  As per HPI  PE:    03/16/2024    9:04 AM 03/01/2024    9:58 AM 02/16/2024    1:25 PM  Vitals with BMI  Height   6' 0.5  Weight 216 lbs 3 oz 215 lbs 219 lbs 10 oz  BMI 28.9 28.74 29.36  Systolic 110 116 95  Diastolic 74 68 54  Pulse 62 49 64     Physical Exam  Gen: Alert, well appearing.  Patient is oriented to person, place, time, and situation. AFFECT: pleasant, lucid thought and speech. No further  exam today  LABS:  Last CBC Lab Results  Component Value Date   WBC 7.2 02/24/2024   HGB 14.4 02/24/2024   HCT 43.3 02/24/2024   MCV 95.7 02/24/2024   MCH 32.3 07/15/2021   RDW 12.5 02/24/2024   PLT 186.0 02/24/2024   Last metabolic panel Lab Results  Component Value Date   GLUCOSE 106 (H) 02/24/2024   NA 139 02/24/2024   K 4.3 02/24/2024   CL 102 02/24/2024   CO2 31 02/24/2024   BUN 18 02/24/2024   CREATININE 1.01 02/24/2024   GFR 97.18 02/24/2024   CALCIUM 9.7 02/24/2024   PHOS 2.9 08/19/2011   PROT 7.5 02/24/2024   ALBUMIN 4.8 02/24/2024   BILITOT 0.5 02/24/2024   ALKPHOS 77 02/24/2024   AST 16 02/24/2024   ALT 19 02/24/2024   ANIONGAP 11 07/15/2021   IMPRESSION AND PLAN:  Chronic urinary frequency, urgency, and nocturia.  He has been treated with multiple medications to target the prostate and these  did not help. He has had significant improvement in daytime symptoms on Ditropan .  Nocturia is still a problem. He wanted to make sure that he did not have infection-->Dipstick UA today is NORMAL.  No changes in management today.  An After Visit Summary was printed and given to the patient.  FOLLOW UP: Return if symptoms worsen or fail to improve.  Signed:  Gerlene Hockey, MD           03/16/2024

## 2024-03-21 ENCOUNTER — Ambulatory Visit: Admitting: Family Medicine

## 2024-03-30 ENCOUNTER — Encounter: Payer: Self-pay | Admitting: Family Medicine

## 2024-03-30 ENCOUNTER — Ambulatory Visit: Admitting: Family Medicine

## 2024-03-30 NOTE — Progress Notes (Deleted)
 OFFICE VISIT  03/30/2024  CC: No chief complaint on file.   Patient is a 33 y.o. male who presents for ***  HPI: ***  Past Medical History:  Diagnosis Date   Alcohol abuse    Alcoholic gastritis 2018   Anxiety and depression    Cervical spondylosis    ED (erectile dysfunction)    Family history of hemochromatosis    Genetic testing of this pt was NEG 03/2017.   GERD (gastroesophageal reflux disease)    IBS (irritable bowel syndrome) 08/26/2011   Male hypogonadism    anabolic steroid use-->stopped-->few years later lab showed low T-->test supp mild help-->stopped d/t side effects-->Dr. Beryl consult->plan is to recheck levels after being off testost for a while (2025)   Mixed hyperlipidemia    OSA (obstructive sleep apnea)    suspected (neurol to do sleep study as of 2025)   Urinary frequency    x years. Urology workup (cystoscopy.  Trial of antibiotics, trial of Flomax and trial of tadalafil no help)    Past Surgical History:  Procedure Laterality Date   COLONOSCOPY W/ BIOPSIES  11/12/2020   CYSTOSCOPY     mild BPH   ESOPHAGOGASTRODUODENOSCOPY     11/12/2020   FOOT SURGERY  08/25/2002   Right foot--Bone spurs [facility])    Outpatient Medications Prior to Visit  Medication Sig Dispense Refill   citalopram  (CELEXA ) 40 MG tablet TAKE 1 TABLET BY MOUTH ONCE DAILY DOSE  INCREASE 30 tablet 1   LORazepam  (ATIVAN ) 0.5 MG tablet Take 1 tablet (0.5 mg total) by mouth 2 (two) times daily as needed for anxiety. 60 tablet 1   montelukast (SINGULAIR) 10 MG tablet Take 10 mg by mouth.     oxybutynin  (DITROPAN  XL) 10 MG 24 hr tablet Take 1 tablet (10 mg total) by mouth at bedtime. 30 tablet 0   pantoprazole  (PROTONIX ) 40 MG tablet Take 1 tablet (40 mg total) by mouth daily. 30 tablet 3   sildenafil (VIAGRA) 50 MG tablet Take 50 mg by mouth daily.     No facility-administered medications prior to visit.    Allergies  Allergen Reactions   Fluoxetine Other (See Comments)    Suicidal  ideation    Review of Systems  As per HPI  PE:    03/16/2024    9:04 AM 03/01/2024    9:58 AM 02/16/2024    1:25 PM  Vitals with BMI  Height   6' 0.5  Weight 216 lbs 3 oz 215 lbs 219 lbs 10 oz  BMI 28.9 28.74 29.36  Systolic 110 116 95  Diastolic 74 68 54  Pulse 62 49 64     Physical Exam  ***  LABS:  {Labs (Optional):23779}  IMPRESSION AND PLAN:  No problem-specific Assessment & Plan notes found for this encounter.   An After Visit Summary was printed and given to the patient.  FOLLOW UP: No follow-ups on file.  @esig @

## 2024-04-05 ENCOUNTER — Other Ambulatory Visit: Payer: Self-pay | Admitting: Family Medicine

## 2024-05-03 ENCOUNTER — Ambulatory Visit: Admitting: Family Medicine

## 2024-05-07 ENCOUNTER — Other Ambulatory Visit: Payer: Self-pay | Admitting: Family Medicine

## 2024-05-17 ENCOUNTER — Ambulatory Visit (INDEPENDENT_AMBULATORY_CARE_PROVIDER_SITE_OTHER): Admitting: Family Medicine

## 2024-05-17 ENCOUNTER — Encounter: Payer: Self-pay | Admitting: Family Medicine

## 2024-05-17 VITALS — BP 127/84 | HR 68 | Temp 98.1°F | Ht 72.5 in | Wt 219.2 lb

## 2024-05-17 DIAGNOSIS — Z5181 Encounter for therapeutic drug level monitoring: Secondary | ICD-10-CM

## 2024-05-17 DIAGNOSIS — N401 Enlarged prostate with lower urinary tract symptoms: Secondary | ICD-10-CM

## 2024-05-17 DIAGNOSIS — E559 Vitamin D deficiency, unspecified: Secondary | ICD-10-CM | POA: Diagnosis not present

## 2024-05-17 DIAGNOSIS — N138 Other obstructive and reflux uropathy: Secondary | ICD-10-CM

## 2024-05-17 DIAGNOSIS — B078 Other viral warts: Secondary | ICD-10-CM | POA: Diagnosis not present

## 2024-05-17 DIAGNOSIS — Z79899 Other long term (current) drug therapy: Secondary | ICD-10-CM | POA: Diagnosis not present

## 2024-05-17 LAB — VITAMIN B12: Vitamin B-12: 339 pg/mL (ref 211–911)

## 2024-05-17 LAB — VITAMIN D 25 HYDROXY (VIT D DEFICIENCY, FRACTURES): VITD: 19.63 ng/mL — ABNORMAL LOW (ref 30.00–100.00)

## 2024-05-17 MED ORDER — PANTOPRAZOLE SODIUM 40 MG PO TBEC: 40.0000 mg | DELAYED_RELEASE_TABLET | Freq: Every day | ORAL | 3 refills | Status: AC

## 2024-05-17 MED ORDER — TAMSULOSIN HCL 0.4 MG PO CAPS: 0.8000 mg | ORAL_CAPSULE | Freq: Every day | ORAL | 0 refills | Status: AC

## 2024-05-17 NOTE — Progress Notes (Signed)
 OFFICE VISIT  05/17/2024  CC:  Chief Complaint  Patient presents with   Medical Management of Chronic Issues    Patient is a 34 y.o. male who presents for 27-month follow-up overactive bladder, vitamin D  deficiency, and GERD. A/P as of last visit: Chronic urinary frequency, urgency, and nocturia.  He has been treated with multiple medications to target the prostate and these did not help. He has had significant improvement in daytime symptoms on Ditropan .  Nocturia is still a problem. He wanted to make sure that he did not have infection-->Dipstick UA today is NORMAL.   No changes in management today.  INTERIM HX: Doing okay overall.  Still struggling with lots of nocturia, does get much sleep. Is hard to tell if the Ditropan  has helped anything at all. He describes some feeling of poor stream and difficulty initiating stream. No dysuria, abdominal pain, pelvic pain, or flank pain.  No blood in urine.  Small wart on left thumb lately.  Tried over-the-counter/home remedies. Gets great benefit from taking pantoprazole  daily for his reflux.  Wonders about what long-term risks of this medicine.  He has not obtained his sleep study yet.  ROS as above, plus--> no fevers, no CP, no SOB, no wheezing, no cough, no dizziness, no HAs, no rashes, no melena/hematochezia.  No polyuria or polydipsia.  No myalgias or arthralgias.  No focal weakness, paresthesias, or tremors.  No acute vision or hearing abnormalities.  No recent changes in lower legs.  No palpitations.      PMP AWARE reviewed today: most recent rx for lorazepam  0.5 mg was filled 04/29/2024, # 60, rx by Dr. Alm Bilis. No red flags.  Past Medical History:  Diagnosis Date   Alcohol abuse    Alcoholic gastritis 2018   Anxiety and depression    Cervical spondylosis    ED (erectile dysfunction)    Family history of hemochromatosis    Genetic testing of this pt was NEG 03/2017.   GERD (gastroesophageal reflux disease)    IBS  (irritable bowel syndrome) 08/26/2011   Male hypogonadism    anabolic steroid use-->stopped-->few years later lab showed low T-->test supp mild help-->stopped d/t side effects-->Dr. Beryl consult->plan is to recheck levels after being off testost for a while (2025)   Mixed hyperlipidemia    OSA (obstructive sleep apnea)    suspected (neurol to do sleep study as of 2025)   Urinary frequency    x years. Urology workup (cystoscopy.  Trial of antibiotics, trial of Flomax  and trial of tadalafil no help)    Past Surgical History:  Procedure Laterality Date   COLONOSCOPY W/ BIOPSIES  11/12/2020   CYSTOSCOPY     mild BPH   ESOPHAGOGASTRODUODENOSCOPY     11/12/2020   FOOT SURGERY  08/25/2002   Right foot--Bone spurs [facility])    Outpatient Medications Prior to Visit  Medication Sig Dispense Refill   citalopram  (CELEXA ) 20 MG tablet Take 20 mg by mouth daily.     LORazepam  (ATIVAN ) 0.5 MG tablet Take 1 tablet (0.5 mg total) by mouth 2 (two) times daily as needed for anxiety. 60 tablet 1   sildenafil (VIAGRA) 50 MG tablet Take 50 mg by mouth daily.     citalopram  (CELEXA ) 40 MG tablet TAKE 1 TABLET BY MOUTH ONCE DAILY DOSE  INCREASE 30 tablet 1   montelukast (SINGULAIR) 10 MG tablet Take 10 mg by mouth. (Patient not taking: Reported on 05/17/2024)     oxybutynin  (DITROPAN -XL) 10 MG 24 hr tablet  TAKE 1 TABLET(10 MG) BY MOUTH AT BEDTIME 30 tablet 0   pantoprazole  (PROTONIX ) 40 MG tablet Take 1 tablet (40 mg total) by mouth daily. 30 tablet 3   No facility-administered medications prior to visit.    Allergies[1]  Review of Systems As per HPI  PE:    05/17/2024   10:28 AM 03/16/2024    9:04 AM 03/01/2024    9:58 AM  Vitals with BMI  Height 6' 0.5    Weight 219 lbs 3 oz 216 lbs 3 oz 215 lbs  BMI 29.3 28.9 28.74  Systolic 127 110 883  Diastolic 84 74 68  Pulse 68 62 49     Physical Exam  Gen: Alert, well appearing.  Patient is oriented to person, place, time, and  situation. AFFECT: pleasant, lucid thought and speech. No further exam today. Punctate verrucous lesion at the base of the left thumb on the palmar aspect.  LABS:  Last CBC Lab Results  Component Value Date   WBC 7.2 02/24/2024   HGB 14.4 02/24/2024   HCT 43.3 02/24/2024   MCV 95.7 02/24/2024   MCH 32.3 07/15/2021   RDW 12.5 02/24/2024   PLT 186.0 02/24/2024   Last metabolic panel Lab Results  Component Value Date   GLUCOSE 106 (H) 02/24/2024   NA 139 02/24/2024   K 4.3 02/24/2024   CL 102 02/24/2024   CO2 31 02/24/2024   BUN 18 02/24/2024   CREATININE 1.01 02/24/2024   GFR 97.18 02/24/2024   CALCIUM 9.7 02/24/2024   PHOS 2.9 08/19/2011   PROT 7.5 02/24/2024   ALBUMIN 4.8 02/24/2024   BILITOT 0.5 02/24/2024   ALKPHOS 77 02/24/2024   AST 16 02/24/2024   ALT 19 02/24/2024   ANIONGAP 11 07/15/2021   Last vitamin D  Lab Results  Component Value Date   VD25OH 22.51 (L) 02/24/2024   Lab Results  Component Value Date   TESTOSTERONE  319.4 10/10/2023   IMPRESSION AND PLAN:  #1 BPH with lower urinary tract obstructive symptoms. I do think he has a little bit of overactive bladder symptoms as well but he has not improved much on Ditropan . Will go ahead and discontinue Ditropan  and start Flomax  0.4 mg tabs, 1 nightly.  He can increase to 2 tabs if not seeing much improvement in 1 week.  2.  GERD.  Well-controlled with pantoprazole  40 mg a day. Risk for malabsorption of B12 and iron with prolonged use. Check B12 and iron levels today.  3.  Wart, left thumb.  Very small and asymptomatic.  Reassured.  No treatment recommended.  4.  OSA (suspected). He was evaluated by neurology and sleep study was recommended but he still needs to arrange this.  We gave him [facility] contact information today.  #5 vitamin D  deficiency. He has been on 1000 unit vitamin D  supplement for approximately 3 months now. Recheck vitamin D  level today.  An After Visit Summary was  printed and given to the patient.  FOLLOW UP: Return in about 4 weeks (around 06/14/2024) for f/u bph.  Signed:  Gerlene Hockey, MD           05/17/2024     [1]  Allergies Allergen Reactions   Fluoxetine Other (See Comments)    Suicidal ideation

## 2024-05-17 NOTE — Patient Instructions (Signed)
 [facility] [phone] [address] [place],  [place]  [address]  Main: [phone]  Fax: [phone]

## 2024-05-18 ENCOUNTER — Ambulatory Visit: Payer: Self-pay | Admitting: Family Medicine

## 2024-05-18 LAB — IRON,TIBC AND FERRITIN PANEL
%SAT: 32 % (ref 20–48)
Ferritin: 119 ng/mL (ref 38–380)
Iron: 115 ug/dL (ref 50–180)
TIBC: 362 ug/dL (ref 250–425)

## 2024-05-29 ENCOUNTER — Other Ambulatory Visit: Payer: Self-pay | Admitting: Family Medicine

## 2024-06-14 ENCOUNTER — Ambulatory Visit: Admitting: Family Medicine
